# Patient Record
Sex: Female | Born: 1990
Health system: Southern US, Community
[De-identification: ages and names within clinical notes are randomized; demographics above are authoritative.]

## PROBLEM LIST (undated history)

## (undated) DIAGNOSIS — I82409 Acute embolism and thrombosis of unspecified deep veins of unspecified lower extremity: Secondary | ICD-10-CM

## (undated) DIAGNOSIS — E282 Polycystic ovarian syndrome: Secondary | ICD-10-CM

## (undated) DIAGNOSIS — D6851 Activated protein C resistance: Secondary | ICD-10-CM

## (undated) DIAGNOSIS — K5792 Diverticulitis of intestine, part unspecified, without perforation or abscess without bleeding: Secondary | ICD-10-CM

## (undated) DIAGNOSIS — K429 Umbilical hernia without obstruction or gangrene: Secondary | ICD-10-CM

## (undated) DIAGNOSIS — K5900 Constipation, unspecified: Secondary | ICD-10-CM

## (undated) DIAGNOSIS — I2699 Other pulmonary embolism without acute cor pulmonale: Secondary | ICD-10-CM

## (undated) DIAGNOSIS — Z9289 Personal history of other medical treatment: Secondary | ICD-10-CM

## (undated) DIAGNOSIS — I871 Compression of vein: Secondary | ICD-10-CM

## (undated) DIAGNOSIS — N83209 Unspecified ovarian cyst, unspecified side: Secondary | ICD-10-CM

## (undated) DIAGNOSIS — D6859 Other primary thrombophilia: Secondary | ICD-10-CM

## (undated) HISTORY — DX: Umbilical hernia without obstruction or gangrene: K42.9

## (undated) HISTORY — DX: Activated protein C resistance: D68.51

## (undated) HISTORY — DX: Compression of vein: I87.1

## (undated) HISTORY — DX: Polycystic ovarian syndrome: E28.2

## (undated) HISTORY — DX: Acute embolism and thrombosis of unspecified deep veins of unspecified lower extremity: I82.409

## (undated) HISTORY — PX: ADENOIDECTOMY: SUR15

## (undated) HISTORY — PX: TONSILLECTOMY AND ADENOIDECTOMY: SUR1326

## (undated) HISTORY — DX: Other primary thrombophilia: D68.59

## (undated) HISTORY — PX: WISDOM TOOTH EXTRACTION: SHX21

---
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
## 2012-05-02 DIAGNOSIS — I749 Embolism and thrombosis of unspecified artery: Secondary | ICD-10-CM | POA: Insufficient documentation

## 2012-05-02 DIAGNOSIS — G4700 Insomnia, unspecified: Secondary | ICD-10-CM | POA: Insufficient documentation

## 2017-03-11 ENCOUNTER — Other Ambulatory Visit (HOSPITAL_COMMUNITY)
Admission: RE | Admit: 2017-03-11 | Discharge: 2017-03-11 | Disposition: A | Discharge status: Home / Self Care | Payer: BLUE CROSS/BLUE SHIELD | Source: Ambulatory Visit | Attending: Obstetrics & Gynecology | Admitting: Obstetrics & Gynecology

## 2017-03-11 ENCOUNTER — Other Ambulatory Visit: Payer: Self-pay | Admitting: Obstetrics & Gynecology

## 2017-03-11 DIAGNOSIS — Z1151 Encounter for screening for human papillomavirus (HPV): Secondary | ICD-10-CM | POA: Insufficient documentation

## 2017-03-11 DIAGNOSIS — Z01411 Encounter for gynecological examination (general) (routine) with abnormal findings: Secondary | ICD-10-CM | POA: Insufficient documentation

## 2017-03-14 LAB — CYTOLOGY - PAP: HPV (WINDOPATH): DETECTED — AB

## 2017-03-26 ENCOUNTER — Encounter: Payer: Self-pay | Admitting: Hematology

## 2017-04-25 ENCOUNTER — Encounter: Payer: Self-pay | Admitting: Hematology

## 2017-04-25 ENCOUNTER — Telehealth: Payer: Self-pay | Admitting: Hematology

## 2017-04-25 ENCOUNTER — Ambulatory Visit (HOSPITAL_BASED_OUTPATIENT_CLINIC_OR_DEPARTMENT_OTHER): Payer: BLUE CROSS/BLUE SHIELD

## 2017-04-25 ENCOUNTER — Ambulatory Visit (HOSPITAL_BASED_OUTPATIENT_CLINIC_OR_DEPARTMENT_OTHER): Payer: BLUE CROSS/BLUE SHIELD | Admitting: Hematology

## 2017-04-25 VITALS — BP 109/74 | HR 80 | Temp 98.7°F | Resp 18 | Ht 70.0 in | Wt 258.3 lb

## 2017-04-25 DIAGNOSIS — I871 Compression of vein: Secondary | ICD-10-CM

## 2017-04-25 DIAGNOSIS — I2782 Chronic pulmonary embolism: Secondary | ICD-10-CM

## 2017-04-25 DIAGNOSIS — I87009 Postthrombotic syndrome without complications of unspecified extremity: Secondary | ICD-10-CM

## 2017-04-25 DIAGNOSIS — I82512 Chronic embolism and thrombosis of left femoral vein: Secondary | ICD-10-CM

## 2017-04-25 DIAGNOSIS — D6859 Other primary thrombophilia: Secondary | ICD-10-CM

## 2017-04-25 LAB — CBC & DIFF AND RETIC
BASO%: 0.2 % (ref 0.0–2.0)
Basophils Absolute: 0 10*3/uL (ref 0.0–0.1)
EOS ABS: 0.1 10*3/uL (ref 0.0–0.5)
EOS%: 1.2 % (ref 0.0–7.0)
HCT: 40.4 % (ref 34.8–46.6)
HEMOGLOBIN: 13.4 g/dL (ref 11.6–15.9)
IMMATURE RETIC FRACT: 1.8 % (ref 1.60–10.00)
LYMPH%: 24.9 % (ref 14.0–49.7)
MCH: 28.4 pg (ref 25.1–34.0)
MCHC: 33.2 g/dL (ref 31.5–36.0)
MCV: 85.6 fL (ref 79.5–101.0)
MONO#: 0.6 10*3/uL (ref 0.1–0.9)
MONO%: 6.3 % (ref 0.0–14.0)
NEUT#: 6.9 10*3/uL — ABNORMAL HIGH (ref 1.5–6.5)
NEUT%: 67.4 % (ref 38.4–76.8)
PLATELETS: 236 10*3/uL (ref 145–400)
RBC: 4.72 10*6/uL (ref 3.70–5.45)
RDW: 14.2 % (ref 11.2–14.5)
Retic %: 2.24 % — ABNORMAL HIGH (ref 0.70–2.10)
Retic Ct Abs: 105.73 10*3/uL — ABNORMAL HIGH (ref 33.70–90.70)
WBC: 10.2 10*3/uL (ref 3.9–10.3)
lymph#: 2.5 10*3/uL (ref 0.9–3.3)

## 2017-04-25 LAB — COMPREHENSIVE METABOLIC PANEL
ALBUMIN: 4.2 g/dL (ref 3.5–5.0)
ALT: 28 U/L (ref 0–55)
AST: 23 U/L (ref 5–34)
Alkaline Phosphatase: 60 U/L (ref 40–150)
Anion Gap: 8 mEq/L (ref 3–11)
BILIRUBIN TOTAL: 0.48 mg/dL (ref 0.20–1.20)
BUN: 12.5 mg/dL (ref 7.0–26.0)
CO2: 26 meq/L (ref 22–29)
CREATININE: 1 mg/dL (ref 0.6–1.1)
Calcium: 9.6 mg/dL (ref 8.4–10.4)
Chloride: 106 mEq/L (ref 98–109)
EGFR: 76 mL/min/{1.73_m2} — ABNORMAL LOW (ref >90)
GLUCOSE: 84 mg/dL (ref 70–140)
Potassium: 3.8 mEq/L (ref 3.5–5.1)
SODIUM: 140 meq/L (ref 136–145)
TOTAL PROTEIN: 7.8 g/dL (ref 6.4–8.3)

## 2017-04-25 NOTE — Telephone Encounter (Signed)
Lab added per 04/25/17 los. Appointment PRN per 04/25/17. Patient was given a copy of the AVS report per 04/25/17 los.

## 2017-04-25 NOTE — Patient Instructions (Signed)
Thank you for choosing [facility] to provide your oncology and hematology care.  To afford each patient quality time with our providers, please arrive 30 minutes before your scheduled appointment time.  If you arrive late for your appointment, you may be asked to reschedule.  We strive to give you quality time with our providers, and arriving late affects you and other patients whose appointments are after yours.  If you are a no show for multiple scheduled visits, you may be dismissed from the clinic at the providers discretion.   Again, thank you for choosing [facility], our hope is that these requests will decrease the amount of time that you wait before being seen by our physicians.  ______________________________________________________________________ Should you have questions after your visit to the  Cancer Center, please contact our office at [phone] between the hours of 8:30 and 4:30 p.m.    Voicemails left after 4:30p.m will not be returned until the following business day.   For prescription refill requests, please have your pharmacy contact us directly.  Please also try to allow 48 hours for prescription requests.   Please contact the scheduling department for questions regarding scheduling.  For scheduling of procedures such as PET scans, CT scans, MRI, Ultrasound, etc please contact central scheduling at [phone].   Resources For Cancer Patients and Caregivers:  American Cancer Society:  800-227-2345  Can help patients locate various types of support and financial assistance Cancer Care: 1-800-813-HOPE (4673) Provides financial assistance, online support groups, medication/co-pay assistance.   [place] DSS:  336-641-3447 Where to apply for food stamps, Medicaid, and utility assistance Medicare Rights Center: 800-333-4114 Helps people with Medicare understand their rights and benefits, navigate the Medicare system, and secure the  quality healthcare they deserve SCAT: 336-333-6589 [place] [employer]'s shared-ride transportation service for eligible riders who have a disability that prevents them from riding the fixed route bus.   For additional information on assistance programs please contact our social worker:   Grier Hock/Abigail Elmore:  [phone] 

## 2017-04-26 LAB — ANTITHROMBIN III: ANTITHROMBIN ACTIVITY: 69 % — AB (ref 75–135)

## 2017-04-29 LAB — PROTHROMBIN GENE MUTATION

## 2017-04-30 LAB — FACTOR 5 LEIDEN

## 2017-05-17 ENCOUNTER — Telehealth: Payer: Self-pay | Admitting: *Deleted

## 2017-05-17 NOTE — Telephone Encounter (Signed)
Pt LVM to inquire about lab results/additional follow up.  In basket message sent to Dr. Irene Limbo for additional instructions.

## 2017-06-11 ENCOUNTER — Telehealth: Payer: Self-pay

## 2017-06-11 ENCOUNTER — Other Ambulatory Visit: Payer: Self-pay

## 2017-06-11 MED ORDER — WARFARIN SODIUM 10 MG PO TABS
5.0000 mg | ORAL_TABLET | Freq: Every day | ORAL | 0 refills | Status: DC
Start: 2017-06-11 — End: 2018-05-09

## 2017-06-11 NOTE — Telephone Encounter (Signed)
Returning pt call regarding warfarin. Pt okay to take partial dose today and have PCP control warfarin from tomorrow on. [place] does not have a coumadin clinic, and cannot monitor pt at this time.

## 2017-06-11 NOTE — Telephone Encounter (Signed)
Spoke with pt again who verbalized frustration regarding not being given prescription for coumadin. Pt seemed angry over the phone and continued stating, "I don't have time for this" "Why can't you just give me 4 pills to get me through the next few days? Why don't you understand how serious this is? Why can't you monitor my coumadin? I am not going to overdose on it. I don't understand what the problem is."   Spoke with Dr. Irene Limbo who agreed to prescribe coumadin for a month, but PCP is expected to monitor and f/u as we do not have a coumadin clinic. The pt has been taking coumadin for years and should not require a hematologist to monitor. Order placed and pt called to let her know that she could p/u prescription.   Expect to hear from patient tomorrow if there is an issue with her PCP monitoring her coumadin.

## 2017-06-11 NOTE — Progress Notes (Signed)
[facility]    HEMATOLOGY/ONCOLOGY CONSULTATION NOTE  Date of Service: 06/11/2017  Patient Care Team: Patient, No Pcp Per as PCP - General (General Practice) Patient, No Pcp Per (General Practice)  CHIEF COMPLAINTS/PURPOSE OF CONSULTATION:  H/o LLE DVT and Bilateral PE  HISTORY OF PRESENTING ILLNESS:   Heather Lloyd is a wonderful 26 y.o. female who has been referred to [place] by Dr Heather Hoffmann MD for input regarding her previous history of left lower extremity DVT and bilateral PE.  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 She was treated with Lovenox and transitioned to Coumadin and has been on Coumadin anticoagulation since then without any hemorrhagic or thromboembolic complications. She was previously followed by peds hematology  by Dr Heather Lloyd at [facility] and then was seen by Heather Blue PA at [facility] on 05/09/2012.  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  Patient reports that she has been following with [facility] oncology Dr Heather Rona Ravens MD in [facility] and has been stably on Coumadin 10 mg on Monday Wednesday Friday and 7.5 mg on the other days. She notes that she has been monitoring her INR at home.  She notes that she has not had any new venous thromboembolism or bleeding issues on Coumadin. She is on progesterone only oral contraception.  She recently transferred to [facility] and is in the process of setting up a primary care physician.  Family history of heterozygous factor V Leiden mutation in her mother. She notes her mother has not had any issues with thrombosis.   MEDICAL HISTORY:  Past Medical History:  Diagnosis Date  . 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 DVT (deep venous thrombosis) ([place]) 2009  .  Factor V Leiden ([place])     SURGICAL HISTORY: History reviewed. No pertinent surgical history.  SOCIAL HISTORY: Social History   Social History  . Marital status: Married    Spouse name: N/A  . Number of children: N/A  . Years of education: N/A   Occupational History  . Not on file.   Social History Main Topics  . Smoking status: Never Smoker  . Smokeless tobacco: Never Used  . Alcohol use 1.8 oz/week    2 Glasses of wine, 1 Shots of liquor per week  . Drug use: No  . Sexual activity: Yes   Other Topics Concern  . Not on file   Social History Narrative  . No narrative on file    FAMILY HISTORY: Family History  Problem Relation Age of Onset  . Factor V Leiden deficiency Mother   . Diabetes Maternal Grandmother   . Diabetes Maternal Grandfather   . Hypertension Maternal Grandfather     ALLERGIES:  is allergic to codeine and ibuprofen.  MEDICATIONS:  Current Outpatient Prescriptions  Medication Sig Dispense Refill  . norethindrone (MICRONOR,CAMILA,ERRIN) 0.35 MG tablet Take 1 tablet by mouth daily.    Marland Kitchen warfarin (COUMADIN) 10 MG tablet Take 5 mg by mouth daily. MWF 10mg ; All other days 7.5mg      No current facility-administered medications for this visit.     REVIEW OF SYSTEMS:    10 Point review of Systems was done is negative except as noted above.  PHYSICAL EXAMINATION: ECOG PERFORMANCE STATUS: 0 - Asymptomatic  .  Vitals:   04/25/17 1330  BP: 109/74  Pulse: 80  Resp: 18  Temp: 98.7 F (37.1 C)   Filed Weights   04/25/17 1330  Weight: 258 lb 4.8 oz (117.2 kg)   .Body mass index is 37.06 kg/m.  GENERAL:alert, in no acute distress and comfortable SKIN: no acute rashes, no significant lesions EYES: conjunctiva are pink and non-injected, sclera anicteric OROPHARYNX: MMM, no exudates, no oropharyngeal erythema or ulceration NECK: supple, no JVD LYMPH:  no palpable lymphadenopathy in the cervical, axillary or inguinal regions LUNGS: clear to  auscultation b/l with normal respiratory effort HEART: regular rate & rhythm ABDOMEN:  normoactive bowel sounds , non tender, not distended. Extremity: no pedal edema PSYCH: alert & oriented x 3 with fluent speech NEURO: no focal motor/sensory deficits  LABORATORY DATA:  I have reviewed the data as listed  . CBC Latest Ref Rng & Units 04/25/2017  WBC 3.9 - 10.3 10e3/uL 10.2  Hemoglobin 11.6 - 15.9 g/dL 13.4  Hematocrit 34.8 - 46.6 % 40.4  Platelets 145 - 400 10e3/uL 236    . CMP Latest Ref Rng & Units 04/25/2017  Glucose 70 - 140 mg/dl 84  BUN 7.0 - 26.0 mg/dL 12.5  Creatinine 0.6 - 1.1 mg/dL 1.0  Sodium 136 - 145 mEq/L 140  Potassium 3.5 - 5.1 mEq/L 3.8  CO2 22 - 29 mEq/L 26  Calcium 8.4 - 10.4 mg/dL 9.6  Total Protein 6.4 - 8.3 g/dL 7.8  Total Bilirubin 0.20 - 1.20 mg/dL 0.48  Alkaline Phos 40 - 150 U/L 60  AST 5 - 34 U/L 23  ALT 0 - 55 U/L 28           RADIOGRAPHIC STUDIES: I have personally reviewed the radiological images as listed and agreed with the findings in the report. No results found.  ASSESSMENT & PLAN:   26 year old female with  #1 history of unprovoked left lower extremity DVT and bilateral pulmonary embolism with pulmonary infarction at age 66 years. Records note possible May Thurner Syndrome. She has not had any intervention for this. Was also noted to have heterozygous factor V Leiden mutation and low antithrombin III levels as additional hypercoagulable risk factors. She has been seen at [facility] before and recommended long-term anticoagulation. Plan - patient recently transfers from [place] to [place] and is here for Heather additional opinion. -Patient has been tolerating Coumadin well for the last 9 years with no recurrent venous thromboembolism or bleeding issues on Coumadin . Has been doing home INR monitoring . -She reports that her Coumadin dose has remained completely stable over the last few years . She has been on 10 mg Monday Wednesday  Friday and 7.5 mg of Coumadin the rest of the days . -She was recommended to stay on long-term anticoagulation . -We discussed that she needs to set up a primary care physician ASAP in the next week or 2 and that we do not do routine Coumadin monitoring at our clinic and that this would need to be done by her primary care physician . -Her heterozygous factor V Leiden mutation presence was confirmed . -We discussed that if she intends to get pregnant she will need to be switched to a prophylactic dose of Lovenox during pregnancy and for at least 6 weeks after. -We discussed other approaches to preventing recurrent venous thromboembolism including avoiding hydration, using compression socks with traveling and immobility and keeping Heather walking for 5 minutes every hour while traveling.  Patient will follow-up with her primary  care physician locally for continued Coumadin monitoring and other routine medical cares.  Labs today RTC on Heather as needed basis  All of the patients questions were answered with apparent satisfaction. The patient knows to call the clinic with any problems, questions or concerns.  I spent 45 minutes counseling the patient face to face. The total time spent in the appointment was 60 minutes and more than 50% was on counseling and direct patient cares.    Sullivan Lone MD [place] AAHIVMS [facility] Hematology/Oncology Physician [employer]  (Office):       [phone] (Work cell):  [phone] (Fax):           [phone]  06/11/2017 2:27 PM

## 2017-06-11 NOTE — Telephone Encounter (Signed)
This rn started to return pt call, then noticed that [place] had left a message with the pt. This rn asked Samantha RN to call pt back to clarify.

## 2017-06-12 ENCOUNTER — Telehealth: Payer: Self-pay

## 2017-06-12 NOTE — Telephone Encounter (Signed)
Called pt to request name and practice of PCP. Dr. Irene Limbo would like to fax some information for continuity of care.

## 2017-06-14 ENCOUNTER — Telehealth: Payer: Self-pay

## 2017-06-14 NOTE — Telephone Encounter (Signed)
Left message for pt to call back and let us know who her PCP is so that we may transfer information.

## 2017-06-19 ENCOUNTER — Telehealth: Payer: Self-pay

## 2017-06-19 NOTE — Telephone Encounter (Signed)
Pt called back. Her PCP is Dr Seward Carol. Also her warfarin tablet size is normally 5 mg b/c she takes 7.5. She did pick up the 10 mg tabs on 7/23 b/c she was out.

## 2017-10-23 ENCOUNTER — Other Ambulatory Visit: Payer: Self-pay | Admitting: Obstetrics & Gynecology

## 2018-04-19 ENCOUNTER — Emergency Department (HOSPITAL_COMMUNITY): Payer: Commercial Managed Care - PPO

## 2018-04-19 ENCOUNTER — Encounter (HOSPITAL_COMMUNITY): Payer: Self-pay | Admitting: Emergency Medicine

## 2018-04-19 ENCOUNTER — Emergency Department (HOSPITAL_COMMUNITY)
Admission: EM | Admit: 2018-04-19 | Discharge: 2018-04-19 | Disposition: A | Discharge status: Home / Self Care | Payer: Commercial Managed Care - PPO | Attending: Emergency Medicine | Admitting: Emergency Medicine

## 2018-04-19 ENCOUNTER — Other Ambulatory Visit: Payer: Self-pay

## 2018-04-19 DIAGNOSIS — K5732 Diverticulitis of large intestine without perforation or abscess without bleeding: Secondary | ICD-10-CM | POA: Diagnosis not present

## 2018-04-19 DIAGNOSIS — R1032 Left lower quadrant pain: Secondary | ICD-10-CM | POA: Diagnosis present

## 2018-04-19 DIAGNOSIS — Z79899 Other long term (current) drug therapy: Secondary | ICD-10-CM | POA: Insufficient documentation

## 2018-04-19 DIAGNOSIS — D270 Benign neoplasm of right ovary: Secondary | ICD-10-CM | POA: Insufficient documentation

## 2018-04-19 DIAGNOSIS — R1030 Lower abdominal pain, unspecified: Secondary | ICD-10-CM

## 2018-04-19 LAB — COMPREHENSIVE METABOLIC PANEL
ALBUMIN: 3.8 g/dL (ref 3.5–5.0)
ALK PHOS: 56 U/L (ref 38–126)
ALT: 24 U/L (ref 14–54)
ANION GAP: 10 (ref 5–15)
AST: 18 U/L (ref 15–41)
BILIRUBIN TOTAL: 0.5 mg/dL (ref 0.3–1.2)
BUN: 13 mg/dL (ref 6–20)
CALCIUM: 8.9 mg/dL (ref 8.9–10.3)
CO2: 22 mmol/L (ref 22–32)
CREATININE: 0.96 mg/dL (ref 0.44–1.00)
Chloride: 106 mmol/L (ref 101–111)
GFR calc Af Amer: >60 mL/min (ref >60)
GFR calc non Af Amer: >60 mL/min (ref >60)
GLUCOSE: 100 mg/dL — AB (ref 65–99)
Potassium: 3.8 mmol/L (ref 3.5–5.1)
Sodium: 138 mmol/L (ref 135–145)
TOTAL PROTEIN: 7.3 g/dL (ref 6.5–8.1)

## 2018-04-19 LAB — PROTIME-INR
INR: 2.31
Prothrombin Time: 25.2 seconds — ABNORMAL HIGH (ref 11.4–15.2)

## 2018-04-19 LAB — URINALYSIS, ROUTINE W REFLEX MICROSCOPIC
BILIRUBIN URINE: NEGATIVE
Bacteria, UA: NONE SEEN
Glucose, UA: NEGATIVE mg/dL
Ketones, ur: NEGATIVE mg/dL
Leukocytes, UA: NEGATIVE
NITRITE: NEGATIVE
PH: 5 (ref 5.0–8.0)
Protein, ur: NEGATIVE mg/dL
SPECIFIC GRAVITY, URINE: 1.021 (ref 1.005–1.030)

## 2018-04-19 LAB — CBC
HCT: 41.6 % (ref 36.0–46.0)
Hemoglobin: 13.4 g/dL (ref 12.0–15.0)
MCH: 27.6 pg (ref 26.0–34.0)
MCHC: 32.2 g/dL (ref 30.0–36.0)
MCV: 85.8 fL (ref 78.0–100.0)
PLATELETS: 219 10*3/uL (ref 150–400)
RBC: 4.85 MIL/uL (ref 3.87–5.11)
RDW: 14.1 % (ref 11.5–15.5)
WBC: 10.7 10*3/uL — ABNORMAL HIGH (ref 4.0–10.5)

## 2018-04-19 LAB — I-STAT BETA HCG BLOOD, ED (MC, WL, AP ONLY): I-stat hCG, quantitative: <5 m[IU]/mL (ref <5)

## 2018-04-19 LAB — LIPASE, BLOOD: Lipase: 26 U/L (ref 11–51)

## 2018-04-19 MED ORDER — SODIUM CHLORIDE 0.9 % IV BOLUS
1000.0000 mL | Freq: Once | INTRAVENOUS | Status: AC
  Administered 2018-04-19: 1000 mL via INTRAVENOUS

## 2018-04-19 MED ORDER — IOHEXOL 300 MG/ML  SOLN
100.0000 mL | Freq: Once | INTRAMUSCULAR | Status: AC | PRN
  Administered 2018-04-19: 100 mL via INTRAVENOUS

## 2018-04-19 MED ORDER — HYDROCODONE-ACETAMINOPHEN 5-325 MG PO TABS: 1.0000 | ORAL_TABLET | Freq: Four times a day (QID) | ORAL | 0 refills | Status: DC | PRN

## 2018-04-19 MED ORDER — AMOXICILLIN-POT CLAVULANATE 875-125 MG PO TABS: 1.0000 | ORAL_TABLET | Freq: Two times a day (BID) | ORAL | 0 refills | Status: DC

## 2018-04-19 MED ORDER — HYDROCODONE-ACETAMINOPHEN 5-325 MG PO TABS
1.0000 | ORAL_TABLET | Freq: Once | ORAL | Status: AC
  Administered 2018-04-19: 1 via ORAL
  Filled 2018-04-19: qty 1

## 2018-04-19 NOTE — ED Provider Notes (Signed)
[facility] EMERGENCY DEPARTMENT Provider Note   CSN: [id number] Arrival date & time: 04/19/18  1035     History   Chief Complaint Chief Complaint  Patient presents with  . Abdominal Pain    LLQ    HPI Heather Lloyd is a 27 y.o. female with a past medical history of factor V Leiden, anti-thrombin 3 deficiency, who presents today for evaluation of left lower quadrant abdominal pain.  She reports that her pain initially started a few weeks ago when she saw her primary care provider who was concerned that she may have either an ectopic pregnancy or an ovarian cyst.  She followed up with her OB/GYN ordered a transvaginal ultrasound which reportedly showed a cyst on the right side however no cyst on the left side.  She reports that after about a week her pain went away, however it has been back since Wednesday.  She reports that she tried Senokot after the pain started and had a bowel movement out significant relief.  She reports that the pain is waxing and waning, however constant.  She is not not been able to find any exacerbating or relieving factors.  She reports compliance with her Coumadin.   HPI  Past Medical History:  Diagnosis Date  . 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 DVT (deep venous thrombosis) ([place]) 2009  . Factor V Leiden ([place])     There are no active problems to display for this patient.   History reviewed. No pertinent surgical history.   OB History   None      Home Medications    Prior to Admission medications   Medication Sig Start Date End Date Taking? Authorizing Provider  amoxicillin-clavulanate (AUGMENTIN) 875-125 MG tablet Take 1 tablet by mouth every 12 (twelve) hours. 04/19/18   Lorin Glass, PA-C  HYDROcodone-acetaminophen (NORCO/VICODIN) 5-325 MG tablet Take 1 tablet by mouth every 6 (six) hours as needed for severe pain. 04/19/18   Lorin Glass, PA-C  norethindrone (MICRONOR,CAMILA,ERRIN) 0.35 MG tablet Take 1  tablet by mouth daily.    [provider]  warfarin (COUMADIN) 10 MG tablet Take 0.5 tablets (5 mg total) by mouth daily. MWF 10mg ; All other days 7.5mg  06/11/17   Brunetta Genera, MD    Family History Family History  Problem Relation Age of Onset  . Factor V Leiden deficiency Mother   . Diabetes Maternal Grandmother   . Diabetes Maternal Grandfather   . Hypertension Maternal Grandfather     Social History Social History   Tobacco Use  . Smoking status: Never Smoker  . Smokeless tobacco: Never Used  Substance Use Topics  . Alcohol use: Yes    Alcohol/week: 1.8 oz    Types: 2 Glasses of wine, 1 Shots of liquor per week  . Drug use: No     Allergies   Codeine and Ibuprofen   Review of Systems Review of Systems  Constitutional: Negative for chills and fever.  HENT: Negative for sore throat.   Respiratory: Negative for cough and shortness of breath.   Cardiovascular: Negative for chest pain and palpitations.  Gastrointestinal: Positive for abdominal pain and constipation. Negative for blood in stool, diarrhea, nausea and vomiting.  Genitourinary: Negative for dysuria and hematuria.  Musculoskeletal: Negative for arthralgias and back pain.  Skin: Negative for color change and rash.  Neurological: Negative for seizures, syncope and headaches.  All other systems reviewed and are negative.    Physical Exam Updated Vital Signs  BP 132/71   Pulse (!) 102   Temp 98.6 F (37 C) (Oral)   Resp 16   Ht 5\' 10"  (1.778 m)   Wt 120.2 kg (265 lb)   LMP 03/26/2018   SpO2 99%   BMI 38.02 kg/m   Physical Exam  Constitutional: She is oriented to person, place, and time. She appears well-developed and well-nourished. No distress.  HENT:  Head: Normocephalic and atraumatic.  Eyes: Conjunctivae are normal.  Neck: Neck supple.  Cardiovascular: Normal rate and regular rhythm.  No murmur heard. Pulmonary/Chest: Effort normal and breath sounds normal. No respiratory  distress.  Abdominal: Soft. Bowel sounds are normal. There is tenderness in the left lower quadrant. There is no rigidity, no rebound and no guarding.  Abdomen exam is limited by habitus.  Musculoskeletal: She exhibits no edema.  Neurological: She is alert and oriented to person, place, and time.  Skin: Skin is warm and dry.  Psychiatric: She has a normal mood and affect. Her behavior is normal.  Nursing note and vitals reviewed.    ED Treatments / Results  Labs (all labs ordered are listed, but only abnormal results are displayed) Labs Reviewed  COMPREHENSIVE METABOLIC PANEL - Abnormal; Notable for the following components:      Result Value   Glucose, Bld 100 (*)    All other components within normal limits  CBC - Abnormal; Notable for the following components:   WBC 10.7 (*)    All other components within normal limits  URINALYSIS, ROUTINE W REFLEX MICROSCOPIC - Abnormal; Notable for the following components:   Hgb urine dipstick MODERATE (*)    All other components within normal limits  PROTIME-INR - Abnormal; Notable for the following components:   Prothrombin Time 25.2 (*)    All other components within normal limits  LIPASE, BLOOD  I-STAT BETA HCG BLOOD, ED ([facility], [facility], [facility] ONLY)    EKG None  Radiology Ct Abdomen Pelvis W Contrast  Result Date: 04/19/2018 CLINICAL DATA:  Abdominal pain, acute, generalized. EXAM: CT ABDOMEN AND PELVIS WITH CONTRAST TECHNIQUE: Multidetector CT imaging of the abdomen and pelvis was performed using the standard protocol following bolus administration of intravenous contrast. CONTRAST:  148mL OMNIPAQUE IOHEXOL 300 MG/ML  SOLN COMPARISON:  None. FINDINGS: Lower chest: The lung bases are clear without focal nodule, mass, or airspace disease. Heart size is normal. No significant pleural or pericardial effusion is present. Hepatobiliary: There is mild fatty infiltration liver. No focal lesions are present. The common bile duct and gallbladder are  normal. Pancreas: Unremarkable. No pancreatic ductal dilatation or surrounding inflammatory changes. Spleen: Normal in size without focal abnormality. Adrenals/Urinary Tract: Adrenal glands are normal. Kidneys and ureters are within normal limits bilaterally. The urinary bladder is unremarkable. Stomach/Bowel: The stomach and duodenum are within normal limits. Small bowel is unremarkable. The terminal ileum is within normal limits. The appendix is visualized and. The ascending and transverse colon are within normal limits. Proximal descending colon is normal. Foramina tori changes surround the sigmoid colon with diverticular changes. Findings are compatible with acute diverticulitis. There is no focal abscess or free air to suggest perforation. Free fluid does extend into the anatomic pelvis. Vascular/Lymphatic: Extensive venous collaterals extend from the umbilicus to the femoral veins bilaterally. 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 Reproductive: Uterus and left adnexa are within normal limits. A 4.1 x 5.1 x 3.5 cm mass is present in the right adnexa. This mass contains  fat. There is also a focal calcified element which appears to be a tooth. Other: Layering free fluid extends into the anatomic pelvis. No other free fluid or free air is present. No ventral hernia is present. Musculoskeletal: Vertebral body heights alignment are maintained. No focal lytic or blastic lesions are present. IMPRESSION: 1. Sigmoid diverticulitis. 2. 5.1 cm right adnexal mass containing fat and tooth elements compatible with a dermoid. 3. Extensive subcutaneous venous collaterals extending from the umbilicus to the femoral veins bilaterally. 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 4. Hepatic steatosis. Electronically Signed   By: San Morelle M.D.   On: 04/19/2018 15:37    Procedures Procedures  (including critical care time)  Medications Ordered in ED Medications  sodium chloride 0.9 % bolus 1,000 mL (0 mLs Intravenous Stopped 04/19/18 1500)  iohexol (OMNIPAQUE) 300 MG/ML solution 100 mL (100 mLs Intravenous Contrast Given 04/19/18 1449)  HYDROcodone-acetaminophen (NORCO/VICODIN) 5-325 MG per tablet 1 tablet (1 tablet Oral Given 04/19/18 1635)     Initial Impression / Assessment and Plan / ED Course  I have reviewed the triage vital signs and the nursing notes.  Pertinent labs & imaging results that were available during my care of the patient were reviewed by me and considered in my medical decision making (see chart for details).    Patient presents today for evaluation of lower abdominal pain on the left side.  She has previously had a transvaginal ultrasound for evaluation of this pain which revealed a cyst on the right side without any known cause along the left side.  Based on her continued pain and symptoms discussed additional work-up including lab work, CT scan, and consideration for repeat pelvic ultrasound.  Patient had a pelvic exam performed at her OB/GYN/PCP, therefore declined repeat exam.  White count is very slightly elevated at 10.7, UA with moderate blood, patient states she is not not currently menstruating.  Urine not consistent with infection.  After discussions with patient she wishes for CT scan abdomen pelvis.  This was obtained showing left-sided diverticulosis without evidence of abscess or perforation which is the most likely cause for her left-sided pelvic pain.  This will be treated with Augmentin, pain meds.  She is afebrile, and young therefore do not feel like she requires admission.  CT scan also concerning for a teratoma/dermoid cyst with what appears to be a tooth on CT scan per radiologist.  Patient was informed of this, and the need to follow-up with OB/GYN for additional evaluation.  Patient stated her understanding of the importance of following up.  Return  precautions were discussed and patient states her understanding.  PCP/OB/GYN follow-up.  Patient was given follow-up with GI based on her young age and diverticulitis.  Return precautions were discussed with patient and I discharged home.  Prior to prescriptions of narcotic medications the [facility] PMP was queried for the patient.   Final Clinical Impressions(s) / ED Diagnoses   Final diagnoses:  Lower abdominal pain  Teratoma of ovary, right  Diverticulitis of large intestine without perforation or abscess, unspecified bleeding status    ED Discharge Orders        Ordered    HYDROcodone-acetaminophen (NORCO/VICODIN) 5-325 MG tablet  Every 6 hours PRN     04/19/18 1648    amoxicillin-clavulanate (AUGMENTIN) 875-125 MG tablet  Every 12 hours     04/19/18 1648       Lorin Glass, [place] 04/21/18 1118  Drenda Freeze, MD 04/22/18 [phone]

## 2018-04-19 NOTE — ED Notes (Signed)
Pt takes blood thinners, HX PE

## 2018-04-19 NOTE — ED Triage Notes (Signed)
Pt. Stated, Heather Lloyd had stomach pain for 2 weeks went to [place] for [place] has a cyst on rt. Side, continues to have pain on left side.

## 2018-04-19 NOTE — Discharge Instructions (Addendum)
Today your CT scan showed that you have diverticulitis.  This is treated with antibiotics.  I have given you some information to read about it.  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  This needs follow up with your OB/GYN.  Please call their office Monday for an appointment.    Please take Tylenol (acetaminophen) to relieve your pain.  You may take tylenol, up to 1,000 mg (two extra strength pills).  Do not take more than 3,000 mg tylenol in a 24 hour period.  Please check all medication labels as many medications such as pain and cold medications may contain tylenol. Please do not drink alcohol while taking this medication.   Today you received medications that may make you sleepy or impair your ability to make decisions.  For the next 24 hours please do not drive, operate heavy machinery, care for a small child with out another adult present, or perform any activities that may cause harm to you or someone else if you were to fall asleep or be impaired.   You are being prescribed a medication which may make you sleepy. Please follow up of listed precautions for at least 24 hours after taking one dose.  You may have diarrhea from the antibiotics.  It is very important that you continue to take the antibiotics even if you get diarrhea unless a medical professional tells you that you may stop taking them.  If you stop too early the bacteria you are being treated for will become stronger and you may need different, more powerful antibiotics that have more side effects and worsening diarrhea.  Please stay well hydrated and consider probiotics as they may decrease the severity of your diarrhea.  Please be aware that if you take any hormonal contraception (birth control pills, nexplanon, the ring, etc) that your birth control will not work while you are taking antibiotics and you need to use  back up protection as directed on the birth control medication information insert.

## 2018-04-19 NOTE — ED Notes (Signed)
Patient verbalizes understanding of discharge instructions. Opportunity for questioning and answers were provided. Armband removed by staff, pt discharged from ED.  

## 2018-04-30 ENCOUNTER — Inpatient Hospital Stay: Payer: Commercial Managed Care - PPO

## 2018-04-30 ENCOUNTER — Inpatient Hospital Stay: Payer: Commercial Managed Care - PPO | Attending: Hematology & Oncology | Admitting: Hematology & Oncology

## 2018-04-30 ENCOUNTER — Telehealth: Payer: Self-pay | Admitting: Hematology & Oncology

## 2018-04-30 DIAGNOSIS — I82512 Chronic embolism and thrombosis of left femoral vein: Secondary | ICD-10-CM

## 2018-04-30 DIAGNOSIS — D279 Benign neoplasm of unspecified ovary: Secondary | ICD-10-CM | POA: Diagnosis not present

## 2018-04-30 DIAGNOSIS — D682 Hereditary deficiency of other clotting factors: Secondary | ICD-10-CM | POA: Insufficient documentation

## 2018-04-30 DIAGNOSIS — Z7901 Long term (current) use of anticoagulants: Secondary | ICD-10-CM | POA: Insufficient documentation

## 2018-04-30 DIAGNOSIS — Z86711 Personal history of pulmonary embolism: Secondary | ICD-10-CM | POA: Diagnosis not present

## 2018-04-30 DIAGNOSIS — Z86718 Personal history of other venous thrombosis and embolism: Secondary | ICD-10-CM | POA: Insufficient documentation

## 2018-04-30 LAB — CBC WITH DIFFERENTIAL (CANCER CENTER ONLY)
Basophils Absolute: 0 10*3/uL (ref 0.0–0.1)
Basophils Relative: 0 %
Eosinophils Absolute: 0.2 10*3/uL (ref 0.0–0.5)
Eosinophils Relative: 2 %
HEMATOCRIT: 39.7 % (ref 34.8–46.6)
HEMOGLOBIN: 13.2 g/dL (ref 11.6–15.9)
LYMPHS PCT: 33 %
Lymphs Abs: 2.6 10*3/uL (ref 0.9–3.3)
MCH: 28.3 pg (ref 26.0–34.0)
MCHC: 33.2 g/dL (ref 32.0–36.0)
MCV: 85 fL (ref 81.0–101.0)
MONOS PCT: 6 %
Monocytes Absolute: 0.5 10*3/uL (ref 0.1–0.9)
NEUTROS ABS: 4.7 10*3/uL (ref 1.5–6.5)
NEUTROS PCT: 59 %
Platelet Count: 279 10*3/uL (ref 145–400)
RBC: 4.67 MIL/uL (ref 3.70–5.32)
RDW: 13.7 % (ref 11.1–15.7)
WBC Count: 8 10*3/uL (ref 3.9–10.0)

## 2018-04-30 LAB — PROTIME-INR
INR: 1.85
Prothrombin Time: 21.2 seconds — ABNORMAL HIGH (ref 11.4–15.2)

## 2018-04-30 NOTE — Progress Notes (Signed)
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 : I need to figure out what to do about my surgery.  HPI: Ms. Heather Lloyd is a very nice 27 year old white female.  She is from [place] originally.  She currently lives in [place].  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  She has a history of a DVT/pulmonary embolism about 10 years ago.  This was up in [place].  She ultimately ended up at the [facility].  She had a thrombectomy.  She was found to be heterozygous for factor V Leiden.  She also was found to have a low Antithrombin III level.  She has been on Coumadin.  Is been suggested to change her to a new oral anticoagulant.  She has not wanted to do this because of the lack of a reversal agent.  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  She needs surgery.  Her gynecologist want her to see hematology so we could figure out how to get her through surgery without a risk of thrombosis.  She is very much concerned about the possibility of having a blood clot.  She does not have children.  She has done well with Coumadin.  She has her INR checked monthly.  She has some erratic monthly cycles.  She has some chronic left left leg swelling.  She does have a compression stocking that she wears when she exercises.  She has gained some weight.  She does have some diverticulitis.  This is postponed her surgery.  She is been on some antibiotics.  We talked at length about switching to a newer oral anticoagulant.  I am just not sure she needs to be on Coumadin.  I think Eliquis would be a reasonable option for her.  There is a reversal agent for this.  She has had no cough.  She is had no shortness of breath.  Her graph she has had no nausea or vomiting.  Overall, her performance status is ECOG 0.     Past Medical  History:  Diagnosis Date  . Antithrombin III deficiency ([place])   . DVT (deep venous thrombosis) ([place]) 2009  . Factor V Leiden ([place])   :  No past surgical history on file.:   Current Outpatient Medications:  .  polyethylene glycol (MIRALAX / GLYCOLAX) packet, Take 17 g by mouth daily as needed for mild constipation., Disp: , Rfl:  .  norethindrone (MICRONOR,CAMILA,ERRIN) 0.35 MG tablet, Take 1 tablet by mouth daily., Disp: , Rfl:  .  warfarin (COUMADIN) 10 MG tablet, Take 0.5 tablets (5 mg total) by mouth daily. MWF 10mg ; All other days 7.5mg , Disp: 54 tablet, Rfl: 0:  :  Allergies  Allergen Reactions  . Codeine Hives and Swelling  . Ibuprofen     Pt is on blood thinners   :  Family History  Problem Relation Age of Onset  . Factor V Leiden deficiency Mother   . Diabetes Maternal Grandmother   . Diabetes Maternal Grandfather   . Hypertension Maternal Grandfather   :  Social History   Socioeconomic History  . Marital status: Married    Spouse name: Not on file  . Number of children: Not on file  . Years of education: Not on file  . Highest education level: Not on file  Occupational History  . Not on file  Social Needs  . Financial resource strain: Not on file  . Food insecurity:    Worry: Not on file    Inability: Not on file  . Transportation needs:    Medical: Not on file    Non-medical: Not on file  Tobacco Use  . Smoking status: Never Smoker  . Smokeless tobacco: Never Used  Substance and Sexual Activity  . Alcohol use: Yes    Alcohol/week: 1.8 oz    Types: 2 Glasses of wine, 1 Shots of liquor per week  . Drug use: No  . Sexual activity: Yes  Lifestyle  . Physical activity:    Days per week: Not on file    Minutes per session: Not on file  . Stress: Not on file  Relationships  . Social connections:    Talks on phone: Not on file    Gets together: Not on file    Attends religious service: Not on file    Active member of club or organization: Not on  file    Attends meetings of clubs or organizations: Not on file    Relationship status: Not on file  . Intimate partner violence:    Fear of current or ex partner: Not on file    Emotionally abused: Not on file    Physically abused: Not on file    Forced sexual activity: Not on file  Other Topics Concern  . Not on file  Social History Narrative  . Not on file  :  Review of Systems  Constitutional: Negative.   HENT: Negative.   Eyes: Negative.   Respiratory: Negative.   Cardiovascular: Negative.   Gastrointestinal: Negative.   Genitourinary: Negative.   Musculoskeletal: Negative.   Skin: Negative.   Neurological: Negative.   Endo/Heme/Allergies: Negative.   Psychiatric/Behavioral: Negative.      Exam: Obese white female in no obvious distress.  Vital signs show temperature of 97.8.  Pulse 75.  Blood pressure 118/67.  Weight is 264 pounds.  Head neck exam shows no ocular or oral lesions.  There are no palpable cervical or supra clavicular lymph nodes.  Lungs are clear bilaterally.  Cardiac exam regular rate and rhythm with no murmurs, rubs or bruits.  Abdomen is soft.  She is moderately obese.  She has good bowel sounds.  There is no fluid wave.  There is no guarding or rebound tenderness.  There is no palpable liver or spleen tip.  Back exam shows no tenderness over the spine, ribs or hips.  Extremities shows no clubbing, cyanosis or edema.  There might be some slight non-pitting edema of the left leg.  She has good pulses in her distal extremities.  I cannot palpate any venous cord in her legs.  She has a negative Homans sign bilaterally.  Skin exam shows no rashes, ecchymoses or petechia. @IPVITALS @   Recent Labs    04/30/18 0837  WBC 8.0  HGB 13.2  HCT 39.7  PLT 279   No results for input(s): NA, K, CL, CO2, GLUCOSE, BUN, CREATININE, CALCIUM in the last 72 hours.  Blood smear review: None  Pathology: None    Assessment and Plan: Ms. Heather Lloyd is a very nice  27 year old white female.  She has factor V Leiden mutation.  She has low antithrombin 3 level.  She still had one episode of the DVT/PE.  She is on lifelong anticoagulation.  My plan would be to switch her over to Lovenox to get her through her surgery.  I told  her that she would need to be on Lovenox 5 days prior to surgery and then back on Lovenox after surgery until her INR is therapeutic.  She is very much afraid of having a thrombus with surgery while on Lovenox.  I tried to reassure her that I thought that would be highly unlikely.  I do not know if she has a anatomic issue in the pelvis that might predispose her to a thrombus (i.e. May Thurner syndrome).  She thought that she was told that she may have had this.  I told her that if she really wanted to be protective, we get have a retrievable IVC filter placed prior to her surgery and then have it removed after her surgery.  She might want to switch off the Coumadin.  Again I think Eliquis would be a reasonable option for her.  I know that this is twice a day dosing but I think it would be a good treatment for her.  If she were to get pregnant, she would need Lovenox throughout her pregnancy and then for 8 weeks after delivery.  I would like to get a Doppler of her left leg to see how everything looks with respect to residual thromboembolic disease.  We spent a good hour with her today.  All the time was spent face-to-face with her.  We answered all of her questions.  We gave our recommendations.  She has a very good understanding of the situation.  She will let us know when surgery will be.

## 2018-04-30 NOTE — Telephone Encounter (Signed)
No los for 6/19 visit

## 2018-05-01 ENCOUNTER — Encounter (HOSPITAL_COMMUNITY): Payer: Self-pay

## 2018-05-01 MED ORDER — ENOXAPARIN SODIUM 120 MG/0.8ML ~~LOC~~ SOLN: 120.0000 mg | Freq: Two times a day (BID) | SUBCUTANEOUS | 0 refills | Status: DC

## 2018-05-01 NOTE — Addendum Note (Signed)
Addended by: Burney Gauze R on: 05/01/2018 05:45 PM   Modules accepted: Orders

## 2018-05-01 NOTE — H&P (Signed)
27yo G0 who presents for diagnostic laparoscopy, right ovarian cystectomy, possible oophorectomy.  In review, she has been having pelvic discomfort since end of May. Initially the pain was on her left side during menses.  An [place] was performed (04/02/2018) that showed: 7.5cm uterus with thin endometrium and no abnormalities. Right ovary with 4.1cm hyperechoic complex mass- avascular, possible dermoid. Left ovary enlarged with multiple tiny follicles c/w PCOS. No adnexal masses, no free fluid. Since the pain wast mostly on her left side and only during her menses, the plan was to repeat the [place] in a few weeks and if still present proceed with surgical intervention. However, she noted worsening pain and was seen in the ER(6/8) where a CT was completed. Not only was she noted to have diverticulitis, but the cyst was noted to be somewhat larger (5x 4.1x3.5cm) with calficiations- that confirmed a dermoid cyst.  She has since been since by GI- diverticulitis has been treated and was cleared for surgery.   Pt also is FVL heterzygous, antithrombin 3 def. and h/o of PE/DVT in 2009. Currently on warfarin therapy and per pt will be on this medication permanently. Pt was recently seen by hematology and given instructions regarding Lovenox transition before and after surgery      ROS:  CONSTITUTIONAL:  no Chills. no Fatigue. Fever no, no. no Night sweats. no Weight gain. no Weight loss.  HEENT:  Blurrred vision no. no Double vision. no nasal congestion. no Visual changes.  CARDIOLOGY:  Chest pain no, no. no Dyspnea on exertion. no Edema. no Palpitations.  RESPIRATORY:  Shortness of breath no, no. Cough no, no. no Hemoptysis.  UROLOGY:  no Pain with urination. Urinary urgency no, no. Urinary frequency no, no. Urinary incontinence no, no.  GASTROENTEROLOGY:  Patient complains of see HPI for details. Abdominal pain yes, no. no Appetite change. Change in bowel habits yes. no Change in bowel movements. Constipation  yes. no Diarrhea. no Incontinence of stool.  FEMALE REPRODUCTIVE:  See HPI for details. no Abnormal vaginal discharge. no Breast lumps or discharge. Breast pain no, no. no Breast tenderness.  NEUROLOGY:  Dizziness no, no. no Fainting. Headache no, no. no Loss of consciousness.  PSYCHOLOGY:  Anxiety yes, no. Depression no, no.  SKIN:  Rash no, no. Hives no, no.  ENDOCRINOLOGY:  no Cold intolerance. no Excessive thirst. no Excessive urination.  HEMATOLOGY/LYMPH:  no Anemia. no Fatigue. Using Blood Thinners YES, no.         Medical History: Factor 5 Leiden, Antithrombin 3 deficiency, Dvt /pe 2009, Overweight.         Gyn History:  Sexual activity currently sexually active.  Periods : every month.  LMP 03/26/18.  Birth control POP.  Last pap smear date 03/11/2017 ASCUS, HPV+, 10/11/2017 ASCUS.  Denies H/O Last mammogram date.  Denies H/O Abnormal pap smear.  STD Gonorrhea.        OB History:  Never been pregnant per patient.        Surgical History: tonsillectomy , attempt at thrombis extraction left .        Hospitalization/Major Diagnostic Procedure: Blood clot, PE/DVT 2009.        Family History: Father: unknown. Mother: alive, factor v leiden def, diabetes, diagnosed with Diabetes. Paternal [place] Father: unknown. Paternal Grand Mother: unknown. Maternal Grand Father: deceased, Diabetes. Maternal Grand Mother: deceased, Diabetes. 2 brother(s) . .  denies any GYN family cancer hx.       Social History:  General:  Tobacco use  cigarettes: Never smoked Tobacco history last updated 04/22/2018 Additional Findings: Tobacco Non-User Non-smoker for personal reasons Alcohol: yes, occasionally.  no Recreational drug use.  Marital Status: married.  OCCUPATION: Stage manager .        Medications:  Warfarin Sodium . Tablet 10 mg MWF by mouth 7.5 MG TU-TH-SAT-SUN Cyclobenzaprine HCl 5 MG Tablet 1-2 tablets Orally three times a day as needed,  Tramadol  HCl 50 MG Tablet 1 tablet Orally every 12 hours as needed,  Flexeril 10 mg 30 10 mg Tablets one tablet orally at night during menses as needed for pain, Camila(Norethindrone) 0.35 MG Tablet 1 tablet Orally Once a day Vicodin(Acetaminophen-HYDROcodone) 5-300 MG Tablet 1 tablet as needed Orally every 6 hrs,  Amoxicillin 875 MG Tablet 1 tablet Orally Twice a day       Allergies: Ibuprofen, Codeine (for allergy): Swelling - Allergy.    O: Examination in office Objective:     Vitals: Wt 265.9, Wt change .9 lb, Ht 69.75, BMI 38.42, Pulse sitting 88, BP sitting 120/90.       Examination:  General Examination:  CONSTITUTIONAL: well developed, well nourished.  SKIN: warm and dry, no rashes.  NECK: supple, normal appearance.  LUNGS: regular breathing rate and effort.  ABDOMEN: soft, diffuse abdominal pain, mostly lower quadrants, no rebound, no guarding.  MUSCULOSKELETAL no calf tenderness bilaterally.  EXTREMITIES: no edema present.  PSYCH: appropriate mood and affect.        A/P: 27yo G0 who presents for laparoscopic right ovarian cystectomy, possible oophorectomy -NPO -LR @ 125cc/hr -SCDs to OR -h/o DVT on warfarin- transitioned to Lovenox, followed by hematology -Risk/benefit and alternatives reviewed with patient including but not limited to risk of bleeding, infection and injury to surrounding organs.  Discussed ovarian preservation if possible- discussed potential recurrence vs complete oophorectomy.  Concerns and questions were addressed and she desires to proceed.  Janyth Pupa, DO [phone] (cell) [phone] (office)

## 2018-05-01 NOTE — Patient Instructions (Addendum)
Your procedure is scheduled on:  Friday, June 28  Enter through the Main Entrance of [facility] at: 10:15 am  Pick up the phone at the desk and dial [phone].  Call this number if you have problems the morning of surgery: [phone].  Remember: Do NOT eat food or Do NOT drink clear liquids (including water) after midnight Thursday.  Take these medicines the morning of surgery with a SIP OF WATER:  None  Take lovenox as directed by Dr Marin Olp.  Do not take day of surgery.  Stop herbal medications, vitamin supplements, ibuprofen/NSAIDS now.   Do NOT wear jewelry (body piercing), metal hair clips/bobby pins, make-up, or nail polish. Do NOT wear lotions, powders, or perfumes.  You may wear deoderant. Do NOT shave for 48 hours prior to surgery. Do NOT bring valuables to the hospital.  Have a responsible adult drive you home and stay with you for 24 hours after your procedure.  Home with Husband Legrand Como cell [phone] or Mother Verdis Frederickson cell [phone].

## 2018-05-02 ENCOUNTER — Other Ambulatory Visit: Payer: Self-pay | Admitting: *Deleted

## 2018-05-02 MED ORDER — APIXABAN 5 MG PO TABS: 5.0000 mg | ORAL_TABLET | Freq: Two times a day (BID) | ORAL | 12 refills | Status: DC

## 2018-05-02 NOTE — Addendum Note (Signed)
Addended by: Volanda Napoleon on: 05/02/2018 08:18 AM   Modules accepted: Orders

## 2018-05-05 ENCOUNTER — Encounter (HOSPITAL_COMMUNITY)
Admission: RE | Admit: 2018-05-05 | Discharge: 2018-05-05 | Disposition: A | Discharge status: Home / Self Care | Payer: Commercial Managed Care - PPO | Source: Ambulatory Visit | Attending: Obstetrics & Gynecology | Admitting: Obstetrics & Gynecology

## 2018-05-05 ENCOUNTER — Other Ambulatory Visit: Payer: Self-pay

## 2018-05-05 ENCOUNTER — Encounter (HOSPITAL_COMMUNITY): Payer: Self-pay | Admitting: *Deleted

## 2018-05-05 DIAGNOSIS — Z01812 Encounter for preprocedural laboratory examination: Secondary | ICD-10-CM | POA: Insufficient documentation

## 2018-05-05 HISTORY — DX: Constipation, unspecified: K59.00

## 2018-05-05 HISTORY — DX: Personal history of other medical treatment: Z92.89

## 2018-05-05 HISTORY — DX: Other pulmonary embolism without acute cor pulmonale: I26.99

## 2018-05-05 HISTORY — DX: Diverticulitis of intestine, part unspecified, without perforation or abscess without bleeding: K57.92

## 2018-05-05 LAB — COMPREHENSIVE METABOLIC PANEL
ALT: 20 U/L (ref 14–54)
AST: 18 U/L (ref 15–41)
Albumin: 4.1 g/dL (ref 3.5–5.0)
Alkaline Phosphatase: 61 U/L (ref 38–126)
Anion gap: 11 (ref 5–15)
BILIRUBIN TOTAL: 0.4 mg/dL (ref 0.3–1.2)
BUN: 18 mg/dL (ref 6–20)
CHLORIDE: 105 mmol/L (ref 101–111)
CO2: 21 mmol/L — ABNORMAL LOW (ref 22–32)
CREATININE: 1.01 mg/dL — AB (ref 0.44–1.00)
Calcium: 8.7 mg/dL — ABNORMAL LOW (ref 8.9–10.3)
GFR calc Af Amer: >60 mL/min (ref >60)
Glucose, Bld: 102 mg/dL — ABNORMAL HIGH (ref 65–99)
Potassium: 3.9 mmol/L (ref 3.5–5.1)
Sodium: 137 mmol/L (ref 135–145)
Total Protein: 8.4 g/dL — ABNORMAL HIGH (ref 6.5–8.1)

## 2018-05-05 LAB — PROTIME-INR
INR: 2.43
PROTHROMBIN TIME: 26.2 s — AB (ref 11.4–15.2)

## 2018-05-05 LAB — TYPE AND SCREEN
ABO/RH(D): A POS
Antibody Screen: NEGATIVE

## 2018-05-05 LAB — ABO/RH: ABO/RH(D): A POS

## 2018-05-05 NOTE — Pre-Procedure Instructions (Signed)
Called Dr Royce Macadamia.  Informed him of patient's medical history and medications.  Patient has a history of DVT/PE in 2009, Factor V Leiden and Antithrombin III Deficiency.  Patient is cleared for surgery by Dr. Burney Gauze (clearance on chart).  Patient was instructed to take Lovenox 120 mg twice daily (730am & 730pm).  Per Dr Royce Macadamia and Dr. Marin Olp, patient to take Thursday night dose of Lovenox but withhold day of surgery dose.  Patient verbalized understanding.  SDS BB History Log given to lab for patient's previous blood transfusion in [place]

## 2018-05-06 ENCOUNTER — Telehealth: Payer: Self-pay | Admitting: *Deleted

## 2018-05-06 NOTE — Telephone Encounter (Signed)
Message received from patient inquiring how to take Lovenox and Eliquis in regards to her surgery this Friday. She states that her instructions from her pre op visit do not coincide with Dr. Antonieta Pert orders from her visit on 04/30/18.  Call placed back to patient and patient informed per order of Dr. Marin Olp to hold Lovenox on day of surgery and to start Eliquis the day after surgery.  Teach back done.  Patient appreciative of call back and has no further questions or concerns at this time.

## 2018-05-09 ENCOUNTER — Encounter (HOSPITAL_COMMUNITY): Payer: Self-pay

## 2018-05-09 ENCOUNTER — Other Ambulatory Visit: Payer: Self-pay

## 2018-05-09 ENCOUNTER — Ambulatory Visit (HOSPITAL_COMMUNITY): Payer: Commercial Managed Care - PPO | Admitting: Certified Registered Nurse Anesthetist

## 2018-05-09 ENCOUNTER — Ambulatory Visit (HOSPITAL_COMMUNITY)
Admission: AD | Admit: 2018-05-09 | Discharge: 2018-05-09 | Disposition: A | Discharge status: Home / Self Care | Payer: Commercial Managed Care - PPO | Source: Ambulatory Visit | Attending: Obstetrics & Gynecology | Admitting: Obstetrics & Gynecology

## 2018-05-09 ENCOUNTER — Encounter (HOSPITAL_COMMUNITY)
Admission: AD | Disposition: A | Discharge status: Home / Self Care | Payer: Self-pay | Source: Ambulatory Visit | Attending: Obstetrics & Gynecology

## 2018-05-09 DIAGNOSIS — Z833 Family history of diabetes mellitus: Secondary | ICD-10-CM | POA: Diagnosis not present

## 2018-05-09 DIAGNOSIS — D6851 Activated protein C resistance: Secondary | ICD-10-CM | POA: Insufficient documentation

## 2018-05-09 DIAGNOSIS — E669 Obesity, unspecified: Secondary | ICD-10-CM | POA: Insufficient documentation

## 2018-05-09 DIAGNOSIS — Z832 Family history of diseases of the blood and blood-forming organs and certain disorders involving the immune mechanism: Secondary | ICD-10-CM | POA: Insufficient documentation

## 2018-05-09 DIAGNOSIS — D270 Benign neoplasm of right ovary: Secondary | ICD-10-CM | POA: Diagnosis present

## 2018-05-09 DIAGNOSIS — Z86718 Personal history of other venous thrombosis and embolism: Secondary | ICD-10-CM | POA: Insufficient documentation

## 2018-05-09 DIAGNOSIS — Z886 Allergy status to analgesic agent status: Secondary | ICD-10-CM | POA: Diagnosis not present

## 2018-05-09 DIAGNOSIS — Z885 Allergy status to narcotic agent status: Secondary | ICD-10-CM | POA: Insufficient documentation

## 2018-05-09 DIAGNOSIS — Z7901 Long term (current) use of anticoagulants: Secondary | ICD-10-CM | POA: Diagnosis not present

## 2018-05-09 DIAGNOSIS — D6859 Other primary thrombophilia: Secondary | ICD-10-CM | POA: Diagnosis not present

## 2018-05-09 DIAGNOSIS — I2782 Chronic pulmonary embolism: Secondary | ICD-10-CM

## 2018-05-09 HISTORY — PX: OOPHORECTOMY: SHX6387

## 2018-05-09 HISTORY — PX: LAPAROSCOPIC OVARIAN CYSTECTOMY: SHX6248

## 2018-05-09 LAB — PREGNANCY, URINE: PREG TEST UR: NEGATIVE

## 2018-05-09 SURGERY — EXCISION, CYST, OVARY, LAPAROSCOPIC
Anesthesia: General | Site: Abdomen | Laterality: Right

## 2018-05-09 MED ORDER — HYDROMORPHONE HCL 1 MG/ML IJ SOLN
INTRAMUSCULAR | Status: AC
  Filled 2018-05-09: qty 0.5

## 2018-05-09 MED ORDER — PROMETHAZINE HCL 25 MG/ML IJ SOLN: 6.2500 mg | INTRAMUSCULAR | Status: DC | PRN

## 2018-05-09 MED ORDER — TRAMADOL HCL 50 MG PO TABS
ORAL_TABLET | ORAL | Status: AC
  Filled 2018-05-09: qty 1

## 2018-05-09 MED ORDER — MIDAZOLAM HCL 2 MG/2ML IJ SOLN
INTRAMUSCULAR | Status: AC
  Filled 2018-05-09: qty 2

## 2018-05-09 MED ORDER — SUGAMMADEX SODIUM 200 MG/2ML IV SOLN
INTRAVENOUS | Status: AC
Start: 2018-05-09 — End: ?
  Filled 2018-05-09: qty 2

## 2018-05-09 MED ORDER — HYDROMORPHONE HCL 1 MG/ML IJ SOLN
INTRAMUSCULAR | Status: AC
  Administered 2018-05-09: 0.5 mg via INTRAVENOUS
  Filled 2018-05-09: qty 1

## 2018-05-09 MED ORDER — DEXAMETHASONE SODIUM PHOSPHATE 10 MG/ML IJ SOLN
INTRAMUSCULAR | Status: DC | PRN
  Administered 2018-05-09: 10 mg via INTRAVENOUS

## 2018-05-09 MED ORDER — FAMOTIDINE 20 MG PO TABS
20.0000 mg | ORAL_TABLET | Freq: Once | ORAL | Status: AC
  Administered 2018-05-09: 20 mg via ORAL

## 2018-05-09 MED ORDER — BUPIVACAINE HCL (PF) 0.25 % IJ SOLN
INTRAMUSCULAR | Status: DC | PRN
  Administered 2018-05-09: 10 mL

## 2018-05-09 MED ORDER — MIDAZOLAM HCL 2 MG/2ML IJ SOLN
INTRAMUSCULAR | Status: DC | PRN
  Administered 2018-05-09: 2 mg via INTRAVENOUS

## 2018-05-09 MED ORDER — SODIUM CHLORIDE 0.9 % IR SOLN
Status: DC | PRN
  Administered 2018-05-09: 3000 mL

## 2018-05-09 MED ORDER — ROCURONIUM BROMIDE 100 MG/10ML IV SOLN
INTRAVENOUS | Status: AC
  Filled 2018-05-09: qty 1

## 2018-05-09 MED ORDER — ONDANSETRON HCL 4 MG/2ML IJ SOLN
INTRAMUSCULAR | Status: DC | PRN
  Administered 2018-05-09: 4 mg via INTRAVENOUS

## 2018-05-09 MED ORDER — ROCURONIUM BROMIDE 100 MG/10ML IV SOLN
INTRAVENOUS | Status: DC | PRN
  Administered 2018-05-09: 10 mg via INTRAVENOUS
  Administered 2018-05-09: 40 mg via INTRAVENOUS

## 2018-05-09 MED ORDER — SCOPOLAMINE 1 MG/3DAYS TD PT72
MEDICATED_PATCH | TRANSDERMAL | Status: AC
  Administered 2018-05-09: 1.5 mg via TRANSDERMAL
  Filled 2018-05-09: qty 1

## 2018-05-09 MED ORDER — ACETAMINOPHEN 160 MG/5ML PO SOLN
ORAL | Status: AC
  Administered 2018-05-09: 960 mg via ORAL
  Filled 2018-05-09: qty 40.6

## 2018-05-09 MED ORDER — OXYCODONE-ACETAMINOPHEN 5-325 MG PO TABS
1.0000 | ORAL_TABLET | ORAL | Status: DC | PRN
  Administered 2018-05-09: 1 via ORAL

## 2018-05-09 MED ORDER — GLYCOPYRROLATE 0.2 MG/ML IJ SOLN
INTRAMUSCULAR | Status: DC | PRN
  Administered 2018-05-09: 0.2 mg via INTRAVENOUS

## 2018-05-09 MED ORDER — ONDANSETRON HCL 4 MG/2ML IJ SOLN
INTRAMUSCULAR | Status: AC
  Filled 2018-05-09: qty 2

## 2018-05-09 MED ORDER — OXYCODONE-ACETAMINOPHEN 5-325 MG PO TABS: 1.0000 | ORAL_TABLET | Freq: Four times a day (QID) | ORAL | 0 refills | Status: DC | PRN

## 2018-05-09 MED ORDER — LIDOCAINE HCL (CARDIAC) PF 100 MG/5ML IV SOSY
PREFILLED_SYRINGE | INTRAVENOUS | Status: AC
  Filled 2018-05-09: qty 5

## 2018-05-09 MED ORDER — FENTANYL CITRATE (PF) 250 MCG/5ML IJ SOLN
INTRAMUSCULAR | Status: DC | PRN
  Administered 2018-05-09 (×3): 50 ug via INTRAVENOUS
  Administered 2018-05-09: 100 ug via INTRAVENOUS
  Administered 2018-05-09 (×3): 50 ug via INTRAVENOUS

## 2018-05-09 MED ORDER — ACETAMINOPHEN 160 MG/5ML PO SOLN
960.0000 mg | Freq: Once | ORAL | Status: AC
  Administered 2018-05-09: 960 mg via ORAL

## 2018-05-09 MED ORDER — ACETAMINOPHEN 500 MG PO TABS: 1000.0000 mg | ORAL_TABLET | Freq: Once | ORAL | Status: AC

## 2018-05-09 MED ORDER — BUPIVACAINE HCL (PF) 0.25 % IJ SOLN
INTRAMUSCULAR | Status: AC
  Filled 2018-05-09: qty 30

## 2018-05-09 MED ORDER — DEXAMETHASONE SODIUM PHOSPHATE 10 MG/ML IJ SOLN
INTRAMUSCULAR | Status: AC
  Filled 2018-05-09: qty 1

## 2018-05-09 MED ORDER — SCOPOLAMINE 1 MG/3DAYS TD PT72
1.0000 | MEDICATED_PATCH | Freq: Once | TRANSDERMAL | Status: DC
  Administered 2018-05-09: 1.5 mg via TRANSDERMAL

## 2018-05-09 MED ORDER — LACTATED RINGERS IV SOLN
INTRAVENOUS | Status: DC
  Administered 2018-05-09: 125 mL/h via INTRAVENOUS
  Administered 2018-05-09: 12:00:00 via INTRAVENOUS

## 2018-05-09 MED ORDER — LIDOCAINE 2% (20 MG/ML) 5 ML SYRINGE
INTRAMUSCULAR | Status: DC | PRN
  Administered 2018-05-09: 80 mg via INTRAVENOUS

## 2018-05-09 MED ORDER — TRAMADOL HCL 50 MG PO TABS: 50.0000 mg | ORAL_TABLET | Freq: Four times a day (QID) | ORAL | 0 refills | Status: DC | PRN

## 2018-05-09 MED ORDER — FAMOTIDINE 20 MG PO TABS
ORAL_TABLET | ORAL | Status: AC
  Administered 2018-05-09: 20 mg via ORAL
  Filled 2018-05-09: qty 1

## 2018-05-09 MED ORDER — TRAMADOL HCL 50 MG PO TABS: 50.0000 mg | ORAL_TABLET | Freq: Once | ORAL | Status: DC

## 2018-05-09 MED ORDER — FENTANYL CITRATE (PF) 250 MCG/5ML IJ SOLN
INTRAMUSCULAR | Status: AC
  Filled 2018-05-09: qty 5

## 2018-05-09 MED ORDER — SUGAMMADEX SODIUM 200 MG/2ML IV SOLN
INTRAVENOUS | Status: DC | PRN
  Administered 2018-05-09: 200 mg via INTRAVENOUS

## 2018-05-09 MED ORDER — HYDROMORPHONE HCL 1 MG/ML IJ SOLN
0.2500 mg | INTRAMUSCULAR | Status: DC | PRN
  Administered 2018-05-09 (×4): 0.5 mg via INTRAVENOUS

## 2018-05-09 MED ORDER — PROPOFOL 10 MG/ML IV BOLUS
INTRAVENOUS | Status: AC
  Filled 2018-05-09: qty 20

## 2018-05-09 MED ORDER — PROPOFOL 10 MG/ML IV BOLUS
INTRAVENOUS | Status: DC | PRN
  Administered 2018-05-09: 200 mg via INTRAVENOUS

## 2018-05-09 MED ORDER — GLYCOPYRROLATE 0.2 MG/ML IJ SOLN
INTRAMUSCULAR | Status: AC
  Filled 2018-05-09: qty 1

## 2018-05-09 MED ORDER — OXYCODONE-ACETAMINOPHEN 5-325 MG PO TABS
ORAL_TABLET | ORAL | Status: AC
  Administered 2018-05-09: 1 via ORAL
  Filled 2018-05-09: qty 1

## 2018-05-09 MED ORDER — SODIUM CHLORIDE 0.9 % IJ SOLN
INTRAMUSCULAR | Status: AC
  Filled 2018-05-09: qty 10

## 2018-05-09 MED ORDER — HYDROMORPHONE HCL 1 MG/ML IJ SOLN
INTRAMUSCULAR | Status: AC
  Administered 2018-05-09: 0.5 mg via INTRAVENOUS
  Filled 2018-05-09: qty 0.5

## 2018-05-09 SURGICAL SUPPLY — 29 items
DERMABOND ADVANCED (GAUZE/BANDAGES/DRESSINGS) ×1
DERMABOND ADVANCED .7 DNX12 (GAUZE/BANDAGES/DRESSINGS) ×1 IMPLANT
DRSG OPSITE POSTOP 3X4 (GAUZE/BANDAGES/DRESSINGS) IMPLANT
DRSG TELFA 3X8 NADH (GAUZE/BANDAGES/DRESSINGS) ×4 IMPLANT
DURAPREP 26ML APPLICATOR (WOUND CARE) ×2 IMPLANT
FORCEPS CUTTING 33CM 5MM (CUTTING FORCEPS) IMPLANT
GLOVE BIOGEL PI IND STRL 7.0 (GLOVE) ×4 IMPLANT
GLOVE BIOGEL PI INDICATOR 7.0 (GLOVE) ×4
GLOVE ECLIPSE 6.5 STRL STRAW (GLOVE) ×2 IMPLANT
GOWN STRL REUS W/TWL LRG LVL3 (GOWN DISPOSABLE) ×4 IMPLANT
NEEDLE INSUFFLATION 120MM (ENDOMECHANICALS) ×2 IMPLANT
PACK LAPAROSCOPY BASIN (CUSTOM PROCEDURE TRAY) ×2 IMPLANT
PACK TRENDGUARD 450 HYBRID PRO (MISCELLANEOUS) ×1 IMPLANT
PAD OB MATERNITY 4.3X12.25 (PERSONAL CARE ITEMS) ×2 IMPLANT
POUCH SPECIMEN RETRIEVAL 10MM (ENDOMECHANICALS) ×2 IMPLANT
PROTECTOR NERVE ULNAR (MISCELLANEOUS) ×4 IMPLANT
SET IRRIG TUBING LAPAROSCOPIC (IRRIGATION / IRRIGATOR) ×2 IMPLANT
SHEARS HARMONIC ACE PLUS 36CM (ENDOMECHANICALS) ×2 IMPLANT
SLEEVE XCEL OPT CAN 5 100 (ENDOMECHANICALS) ×2 IMPLANT
SOLUTION ELECTROLUBE (MISCELLANEOUS) ×2 IMPLANT
SUT MON AB 4-0 PS1 27 (SUTURE) ×2 IMPLANT
SUT VICRYL 0 UR6 27IN ABS (SUTURE) IMPLANT
SYSTEM CARTER THOMASON II (TROCAR) ×2 IMPLANT
TOWEL OR 17X24 6PK STRL BLUE (TOWEL DISPOSABLE) ×4 IMPLANT
TRAY FOLEY W/BAG SLVR 14FR (SET/KITS/TRAYS/PACK) ×2 IMPLANT
TRENDGUARD 450 HYBRID PRO PACK (MISCELLANEOUS) ×2
TROCAR XCEL NON-BLD 11X100MML (ENDOMECHANICALS) ×2 IMPLANT
TROCAR XCEL NON-BLD 5MMX100MML (ENDOMECHANICALS) ×2 IMPLANT
WARMER LAPAROSCOPE (MISCELLANEOUS) ×2 IMPLANT

## 2018-05-09 NOTE — Op Note (Signed)
PREOPERATIVE DIAGNOSIS:  Right dermoid cyst POSTOPERATIVE DIAGNOSIS: same PROCEDURE PERFORMED: Laparoscopic right oophorectomy SURGEON: Dr. Janyth Pupa ASSISTANT:Dr. Cyndia Skeeters ANESTHESIA: General endotracheal.  ESTIMATED BLOOD LOSS: 20ccc.  URINE OUTPUT: 150cc of clear yellow urine at the end of the procedure.  IV FLUIDS: 1200cc of crystalloid.  SPECIMEN(S): right ovary with dermoid cyst COMPLICATIONS: None.  CONDITION: Stable.  FINDINGS: No ascites or peritoneal studding was appreciated.  Liver, gallbladder and bowel appeared grossly normal. Uterus normal size and shape. Enlarged right ovary, normal left ovary, normal bilateral fallopian tubes.     Informed consent was obtained from the patient prior to taking her to the operating room where anesthesia was found to be adequate. She was placed in dorsal lithotomy position and examined under anesthesia. She was prepped and draped in normal sterile fashion. The bladder was catheterized with a foley under sterile technique.  A bi-valve speculum was then placed and the anterior lip of the cervix was grasped with the single tooth tenaculum. A Hulka manipulator was placed.  The speculum and tenaculum were then removed.  Attention was then turned to the patients abdomen where a 10 mm infraumbilical skin incision was made with the scalpel. The veress needle was carefully introduced into the peritoneal cavity while tenting the abdominal wall. Intraperitoneal placement was confirmed by use of a saline-drop test.  The gas was connected and confirmed intrabdominal placement by a low initial pressure of 85mmHg. The abdomen was then insuflated with CO2 gas. The trocar and sleeve were then advanced without difficulty into the abdomen under direct visualization. Intraabdominal placement was confirmed by the laparoscope and surveillance of the abdomen was performed. Grossly normal appearing abdomen as mentioned in findings above.  Two additional 70mm skin  incision were made in the left and right lower quadrants with placement of the trocar under direct visualization.   The right ovary was inspected, unfortunately no abnormal ovarian tissue was appreciated.  The harmonic was used for serial ligation and dissection of the right ovary.  The endocatch bag was then placed through the 11 mm port and the specimens was removed in its entirety. Re-examination of the uterus and abdominal cavity confirmed hemostasis.  The instruments were then removed from the patients abdomen with air allowed to fully escape. The fascia was closed under direct visualization using 0-vicryl.  Th umbilical site was closed with monocryl. The other incisions were closed with dermabond.  The manipulator was then removed from the cervix. The patient tolerated the procedure well with all sponge, lap, and  needle counts correct. The patient was taken to recovery in stable condition.  Janyth Pupa, DO [phone] (cell) [phone] (office)

## 2018-05-09 NOTE — Interval H&P Note (Signed)
History and Physical Interval Note:  05/09/2018 10:54 AM  Heather Lloyd  has presented today for surgery, with the diagnosis of D27.0 Dermoid cyst of right ovary  The various methods of treatment have been discussed with the patient and family. After consideration of risks, benefits and other options for treatment, the patient has consented to  Procedure(s): LAPAROSCOPIC OVARIAN CYSTECTOMY (Right) OOPHORECTOMY (Right) possible as a surgical intervention .  The patient's history has been reviewed, patient examined, no change in status, stable for surgery.  I have reviewed the patient's chart and labs.  Questions were answered to the patient's satisfaction.     [name]

## 2018-05-09 NOTE — Transfer of Care (Signed)
Immediate Anesthesia Transfer of Care Note  Patient: Heather Lloyd  Procedure(s) Performed: LAPAROSCOPIC OVARIAN CYSTECTOMY (Right Abdomen) OOPHORECTOMY (Right Abdomen)  Patient Location: PACU  Anesthesia Type:General  Level of Consciousness: drowsy  Airway & Oxygen Therapy: Patient Spontanous Breathing and Patient connected to nasal cannula oxygen  Post-op Assessment: Report given to RN and Post -op Vital signs reviewed and stable  Post vital signs: Reviewed and stable  Last Vitals:  Vitals Value Taken Time  BP 126/80 05/09/2018 12:47 PM  Temp    Pulse 95 05/09/2018 12:50 PM  Resp 12 05/09/2018 12:50 PM  SpO2 97 % 05/09/2018 12:50 PM  Vitals shown include unvalidated device data.  Last Pain:  Vitals:   05/09/18 1039  TempSrc: Oral  PainSc: 1       Patients Stated Pain Goal: 4 (61/44/31 5400)  Complications: No apparent anesthesia complications

## 2018-05-09 NOTE — Anesthesia Postprocedure Evaluation (Signed)
Anesthesia Post Note  Patient: Heather Lloyd  Procedure(s) Performed: LAPAROSCOPIC OVARIAN CYSTECTOMY (Right Abdomen) OOPHORECTOMY (Right Abdomen)     Patient location during evaluation: PACU Anesthesia Type: General Level of consciousness: awake and alert Pain management: pain level controlled Vital Signs Assessment: post-procedure vital signs reviewed and stable Respiratory status: spontaneous breathing, nonlabored ventilation, respiratory function stable and patient connected to nasal cannula oxygen Cardiovascular status: blood pressure returned to baseline and stable Postop Assessment: no apparent nausea or vomiting Anesthetic complications: no    Last Vitals:  Vitals:   05/09/18 1315 05/09/18 1429  BP: 128/74 131/77  Pulse: 74 76  Resp: 15 16  Temp:  36.5 C  SpO2: 97% 95%    Last Pain:  Vitals:   05/09/18 1429  TempSrc:   PainSc: 6    Pain Goal: Patients Stated Pain Goal: 4 (05/09/18 1039)               Asad Keeven P Delesa Kawa

## 2018-05-09 NOTE — Anesthesia Preprocedure Evaluation (Addendum)
Anesthesia Evaluation  Patient identified by MRN, date of birth, ID band Patient awake    Reviewed: Allergy & Precautions, NPO status , Patient's Chart, lab work & pertinent test results  Airway Mallampati: III  TM Distance: >3 FB Neck ROM: Full    Dental no notable dental hx.    Pulmonary PE   Pulmonary exam normal breath sounds clear to auscultation       Cardiovascular + DVT  negative cardio ROS Normal cardiovascular exam(-) Valvular Problems/Murmurs Rhythm:Regular Rate:Normal     Neuro/Psych negative neurological ROS  negative psych ROS   GI/Hepatic negative GI ROS, Neg liver ROS,   Endo/Other  negative endocrine ROS  Renal/GU negative Renal ROS     Musculoskeletal negative musculoskeletal ROS (+)   Abdominal (+) + obese,   Peds  Hematology Antithrombin III deficiency  Factor V Leiden  Pre-op eval per Volanda Napoleon, MD Oncology     Anesthesia Other Findings Dermoid cyst of right ovary  Reproductive/Obstetrics hcg negative                           Anesthesia Physical Anesthesia Plan  ASA: II  Anesthesia Plan: General   Post-op Pain Management:    Induction: Intravenous  PONV Risk Score and Plan: 4 or greater and Ondansetron, Dexamethasone, Scopolamine patch - Pre-op and Midazolam  Airway Management Planned: Oral ETT  Additional Equipment:   Intra-op Plan:   Post-operative Plan: Extubation in OR  Informed Consent: I have reviewed the patients History and Physical, chart, labs and discussed the procedure including the risks, benefits and alternatives for the proposed anesthesia with the patient or authorized representative who has indicated his/her understanding and acceptance.   Dental advisory given  Plan Discussed with: CRNA  Anesthesia Plan Comments:         Anesthesia Quick Evaluation

## 2018-05-09 NOTE — Anesthesia Procedure Notes (Signed)
Procedure Name: Intubation Date/Time: 05/09/2018 11:14 AM Performed by: Raenette Rover, CRNA Pre-anesthesia Checklist: Patient identified, Emergency Drugs available, Suction available and Patient being monitored Patient Re-evaluated:Patient Re-evaluated prior to induction Oxygen Delivery Method: Circle system utilized Preoxygenation: Pre-oxygenation with 100% oxygen Induction Type: IV induction Ventilation: Mask ventilation without difficulty Laryngoscope Size: Mac and 3 Grade View: Grade I Tube type: Oral Tube size: 7.0 mm Number of attempts: 1 Airway Equipment and Method: Stylet Placement Confirmation: ETT inserted through vocal cords under direct vision,  positive ETCO2,  CO2 detector and breath sounds checked- equal and bilateral Secured at: 22 cm Tube secured with: Tape Dental Injury: Teeth and Oropharynx as per pre-operative assessment

## 2018-05-09 NOTE — Discharge Instructions (Addendum)
General Anesthesia, Adult, Care After These instructions provide you with information about caring for yourself after your procedure. Your health care provider may also give you more specific instructions. Your treatment has been planned according to current medical practices, but problems sometimes occur. Call your health care provider if you have any problems or questions after your procedure. What can I expect after the procedure? After the procedure, it is common to have:  Vomiting.  A sore throat.  Mental slowness.  It is common to feel:  Nauseous.  Cold or shivery.  Sleepy.  Tired.  Sore or achy, even in parts of your body where you did not have surgery.  Follow these instructions at home: For at least 24 hours after the procedure:  Do not: ? Participate in activities where you could fall or become injured. ? Drive. ? Use heavy machinery. ? Drink alcohol. ? Take sleeping pills or medicines that cause drowsiness. ? Make important decisions or sign legal documents. ? Take care of children on your own.  Rest. Eating and drinking  If you vomit, drink water, juice, or soup when you can drink without vomiting.  Drink enough fluid to keep your urine clear or pale yellow.  Make sure you have little or no nausea before eating solid foods.  Follow the diet recommended by your health care provider. General instructions  Have a responsible adult stay with you until you are awake and alert.  Return to your normal activities as told by your health care provider. Ask your health care provider what activities are safe for you.  Take over-the-counter and prescription medicines only as told by your health care provider.  If you smoke, do not smoke without supervision.  Keep all follow-up visits as told by your health care provider. This is important. Contact a health care provider if:  You continue to have nausea or vomiting at home, and medicines are not helpful.  You  cannot drink fluids or start eating again.  You cannot urinate after 8-12 hours.  You develop a skin rash.  You have fever.  You have increasing redness at the site of your procedure. Get help right away if:  You have difficulty breathing.  You have chest pain.  You have unexpected bleeding.  You feel that you are having a life-threatening or urgent problem. This information is not intended to replace advice given to you by your health care provider. Make sure you discuss any questions you have with your health care provider. Document Released: 02/04/2001 Document Revised: 04/02/2016 Document Reviewed: 10/13/2015 Elsevier Interactive Patient Education  2018 [place] within 3 days.    HOME INSTRUCTIONS  Please note any unusual or excessive bleeding, pain, swelling. Mild dizziness or drowsiness are normal for about 24 hours after surgery.   Shower when comfortable  Restrictions: No driving for 24 hours or while taking pain medications.  Activity:  No heavy lifting (> 10 lbs), nothing in vagina (no tampons, douching, or intercourse) x 4 weeks; no tub baths for 4 weeks Vaginal spotting is expected but if your bleeding is heavy, period like,  please call the office   Incision: the bandaids will fall off when they are ready to; you may clean your incision with mild soap and water but do not rub or scrub the incision site.  You may experience slight bloody drainage from your incision periodically.  This is normal.  If you experience a large amount of drainage or the incision opens, please  call your physician who will likely direct you to the emergency department.  Diet:  You may return to your regular diet.  Do not eat large meals.  Eat small frequent meals throughout the day.  Continue to drink a good amount of water at least 6-8 glasses of water per day, hydration is very important for the healing process.  Pain Management: Take tramadol and/or tylenol as  prescribed/needed for pain.  Tramadol may cause constipation so be sure to continue with miralax as needed.  Always take prescription pain medication with food, it may cause constipation, increase fluids and fiber and you may want to take an over-the-counter stool softener like Colace as needed up to 2x a day.    Alcohol -- Avoid for 24 hours and while taking pain medications.  Nausea: Take sips of ginger ale or soda  Fever -- Call physician if temperature over 101 degrees  Follow up:  If you do not already have a follow up appointment scheduled, please call the office at [phone].  If you experience fever (a temperature greater than 100.4), pain unrelieved by pain medication, shortness of breath, swelling of a single leg, or any other symptoms which are concerning to you please the office immediately.

## 2018-05-10 ENCOUNTER — Encounter (HOSPITAL_COMMUNITY): Payer: Self-pay | Admitting: Obstetrics & Gynecology

## 2018-05-11 ENCOUNTER — Inpatient Hospital Stay (HOSPITAL_COMMUNITY): Payer: Commercial Managed Care - PPO

## 2018-05-11 ENCOUNTER — Encounter (HOSPITAL_COMMUNITY): Payer: Self-pay | Admitting: *Deleted

## 2018-05-11 ENCOUNTER — Inpatient Hospital Stay (HOSPITAL_COMMUNITY)
Admission: AD | Admit: 2018-05-11 | Discharge: 2018-05-11 | Disposition: A | Discharge status: Home / Self Care | Payer: Commercial Managed Care - PPO | Source: Ambulatory Visit | Attending: Obstetrics & Gynecology | Admitting: Obstetrics & Gynecology

## 2018-05-11 DIAGNOSIS — G8918 Other acute postprocedural pain: Secondary | ICD-10-CM

## 2018-05-11 LAB — COMPREHENSIVE METABOLIC PANEL
ALK PHOS: 51 U/L (ref 38–126)
ALT: 82 U/L — AB (ref 0–44)
AST: 48 U/L — AB (ref 15–41)
Albumin: 3.9 g/dL (ref 3.5–5.0)
Anion gap: 9 (ref 5–15)
BILIRUBIN TOTAL: 0.5 mg/dL (ref 0.3–1.2)
BUN: 14 mg/dL (ref 6–20)
CALCIUM: 8.6 mg/dL — AB (ref 8.9–10.3)
CHLORIDE: 106 mmol/L (ref 98–111)
CO2: 23 mmol/L (ref 22–32)
CREATININE: 1 mg/dL (ref 0.44–1.00)
Glucose, Bld: 84 mg/dL (ref 70–99)
Potassium: 3.8 mmol/L (ref 3.5–5.1)
Sodium: 138 mmol/L (ref 135–145)
TOTAL PROTEIN: 7 g/dL (ref 6.5–8.1)

## 2018-05-11 LAB — CBC WITH DIFFERENTIAL/PLATELET
BASOS PCT: 0 %
Basophils Absolute: 0 10*3/uL (ref 0.0–0.1)
EOS ABS: 0.2 10*3/uL (ref 0.0–0.7)
EOS PCT: 2 %
HCT: 37.3 % (ref 36.0–46.0)
Hemoglobin: 11.8 g/dL — ABNORMAL LOW (ref 12.0–15.0)
Lymphocytes Relative: 36 %
Lymphs Abs: 3.5 10*3/uL (ref 0.7–4.0)
MCH: 28 pg (ref 26.0–34.0)
MCHC: 31.6 g/dL (ref 30.0–36.0)
MCV: 88.6 fL (ref 78.0–100.0)
Monocytes Absolute: 0.5 10*3/uL (ref 0.1–1.0)
Monocytes Relative: 5 %
NEUTROS PCT: 57 %
Neutro Abs: 5.6 10*3/uL (ref 1.7–7.7)
PLATELETS: 203 10*3/uL (ref 150–400)
RBC: 4.21 MIL/uL (ref 3.87–5.11)
RDW: 14.2 % (ref 11.5–15.5)
WBC: 9.7 10*3/uL (ref 4.0–10.5)

## 2018-05-11 LAB — RAPID URINE DRUG SCREEN, HOSP PERFORMED
AMPHETAMINES: NOT DETECTED
BENZODIAZEPINES: NOT DETECTED
Cocaine: NOT DETECTED
OPIATES: NOT DETECTED
Tetrahydrocannabinol: NOT DETECTED

## 2018-05-11 LAB — URINALYSIS, ROUTINE W REFLEX MICROSCOPIC
Bilirubin Urine: NEGATIVE
GLUCOSE, UA: NEGATIVE mg/dL
Ketones, ur: NEGATIVE mg/dL
Leukocytes, UA: NEGATIVE
NITRITE: NEGATIVE
PH: 6 (ref 5.0–8.0)
Protein, ur: NEGATIVE mg/dL
Specific Gravity, Urine: 1.009 (ref 1.005–1.030)

## 2018-05-11 MED ORDER — HYDROMORPHONE HCL 1 MG/ML IJ SOLN
1.0000 mg | INTRAMUSCULAR | Status: AC
  Administered 2018-05-11: 1 mg via INTRAMUSCULAR
  Filled 2018-05-11: qty 1

## 2018-05-11 MED ORDER — HYDROMORPHONE HCL 2 MG PO TABS: 1.0000 mg | ORAL_TABLET | Freq: Four times a day (QID) | ORAL | 0 refills | Status: DC | PRN

## 2018-05-11 MED ORDER — PROMETHAZINE HCL 12.5 MG PO TABS: 12.5000 mg | ORAL_TABLET | Freq: Four times a day (QID) | ORAL | 0 refills | Status: DC | PRN

## 2018-05-11 MED ORDER — DIPHENHYDRAMINE HCL 25 MG PO CAPS: 25.0000 mg | ORAL_CAPSULE | ORAL | Status: DC | PRN

## 2018-05-11 NOTE — MAU Note (Signed)
Had cyst & r ovary removed on Friday, has been taking Rx pain meds and it hasn't helped. Some brown discharge.

## 2018-05-11 NOTE — MAU Note (Signed)
Urine in lab 

## 2018-05-11 NOTE — Progress Notes (Signed)
Talked with pharmacist and verified no contraindication for IM dilaudid with pts codeine allergy.

## 2018-05-11 NOTE — MAU Provider Note (Signed)
Chief Complaint: Abdominal Pain  [facility] pts.   SUBJECTIVE HPI: Heather Lloyd is a 27 y.o. No obstetric history on file who presents to MAU s/p right dermoid cyst with right oophorectomy on 05/09/2018. Pt endorses taking her prescribed pain meds with no relief. Pt endorses being prescribed for discharge 5mg /325mg  of percocet, and pt started off taking one percocet and denies it helping, pt then went to 2 percocet in them middle of the night and that's still did not help, then pt called oncall line and inform her to take 3 percocet with one pill of 5mg  of flexeril and pt performed this regimen today at 0800am and 2pm with no relief. Pt endorses the pain in the worse in the center of her stomach. Pt endorse movement makes it worse, sitting still makes it feel better. Movement pain = 7/10 and sitting 5/10. Pt is described at shooting. Pt endorses last BM this morning, log formed in size and large. Pt was also dx with diverticulitis and tx with abx, pt had left lower quandrent pain before that but resolved with treatment. Then they found the dermoid cyst. Pt endorses taking dilaudid in the past and having no pain relief with that, also took Vicodin and had no relief with that. Pt denies ecchymosis or bleeding, no cp, sob, v, d, fever. Pt denies h/o anxiety or depression. Pt mention that she was told by GI that she had an enlarged liver, next appointment with GI is July 20.    Past Medical History:  Diagnosis Date  . 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 Constipation    mild  . Diverticulitis    no current problem  . DVT (deep venous thrombosis) ([place]) 2009   [place] - femoral vein of left lower extremity  . Factor V Leiden ([place])   . History of blood transfusion 2009   [place] [facility]  . Pulmonary embolism ([place]) 2009   surgery to remove clot from left lung   OB History  No data available   Past Surgical History:  Procedure Laterality Date  . LAPAROSCOPIC OVARIAN  CYSTECTOMY Right 05/09/2018   Procedure: LAPAROSCOPIC OVARIAN CYSTECTOMY;  Surgeon: Janyth Pupa, DO;  Location: [facility];  Service: Gynecology;  Laterality: Right;  . OOPHORECTOMY Right 05/09/2018   Procedure: OOPHORECTOMY;  Surgeon: Janyth Pupa, DO;  Location: [facility];  Service: Gynecology;  Laterality: Right;  . THROMBECTOMY  2009   10 yrs ago in [place]  . TONSILLECTOMY AND ADENOIDECTOMY    . WISDOM TOOTH EXTRACTION     Social History   Socioeconomic History  . Marital status: Married    Spouse name: Not on file  . Number of children: Not on file  . Years of education: Not on file  . Highest education level: Not on file  Occupational History  . Not on file  Social Needs  . Financial resource strain: Not on file  . Food insecurity:    Worry: Not on file    Inability: Not on file  . Transportation needs:    Medical: Not on file    Non-medical: Not on file  Tobacco Use  . Smoking status: Never Smoker  . Smokeless tobacco: Never Used  Substance and Sexual Activity  . Alcohol use: Yes    Alcohol/week: 1.8 oz    Types: 2 Glasses of wine, 1 Shots of liquor per week  . Drug use: No  . Sexual activity: Yes    Birth control/protection: Pill  Lifestyle  . Physical activity:    Days per week: Not on file    Minutes per session: Not on file  . Stress: Not on file  Relationships  . Social connections:    Talks on phone: Not on file    Gets together: Not on file    Attends religious service: Not on file    Active member of club or organization: Not on file    Attends meetings of clubs or organizations: Not on file    Relationship status: Not on file  . Intimate partner violence:    Fear of current or ex partner: Not on file    Emotionally abused: Not on file    Physically abused: Not on file    Forced sexual activity: Not on file  Other Topics Concern  . Not on file  Social History Narrative  . Not on file   No current facility-administered medications on file  prior to encounter.    Current Outpatient Medications on File Prior to Encounter  Medication Sig Dispense Refill  . apixaban (ELIQUIS) 5 MG TABS tablet Take 1 tablet (5 mg total) by mouth 2 (two) times daily. 60 tablet 12  . cyclobenzaprine (FLEXERIL) 5 MG tablet Take 5 mg by mouth at bedtime as needed.     . enoxaparin (LOVENOX) 120 MG/0.8ML injection Inject 0.8 mLs (120 mg total) into the skin every 12 (twelve) hours. 20 Syringe 0  . Melatonin 5 MG TABS Take 5 mg by mouth at bedtime as needed (sleep).    . Multiple Vitamin (MULTIVITAMIN WITH MINERALS) TABS tablet Take 2 tablets by mouth daily.    . norethindrone (MICRONOR,CAMILA,ERRIN) 0.35 MG tablet Take 1 tablet by mouth at bedtime.     Marland Kitchen oxyCODONE-acetaminophen (PERCOCET) 5-325 MG tablet Take 1-2 tablets by mouth every 6 (six) hours as needed for up to 7 days for severe pain. 30 tablet 0  . polyethylene glycol (MIRALAX / GLYCOLAX) packet Take 17 g by mouth 2 (two) times daily.     . traMADol (ULTRAM) 50 MG tablet Take 1 tablet (50 mg total) by mouth every 6 (six) hours as needed for up to 7 days for moderate pain or severe pain. 28 tablet 0  . warfarin (COUMADIN) 5 MG tablet Take 7.5-10 mg by mouth See admin instructions. Take 10 mg by mouth once daily at 10 pm on Mon, Wed, and Fri.  Take 7.5 mg by mouth once daily at 10 pm on Tues, Thurs, Sat, and Sun     Allergies  Allergen Reactions  . Codeine Hives and Swelling  . Ibuprofen     Pt is on blood thinners     I have reviewed the past Medical Hx, Surgical Hx, Social Hx, Allergies and Medications.   REVIEW OF SYSTEMS All systems reviewed and are negative for acute change except as noted in the HPI.   OBJECTIVE BP 124/65   Pulse 68   Temp 98.3 F (36.8 C) (Oral)   Resp 18   Ht 5\' 10"  (1.778 m)   Wt 123.4 kg (272 lb)   LMP 04/24/2018 (Exact Date)   BMI 39.03 kg/m    PHYSICAL EXAM Constitutional: Well-developed, well-nourished female in no acute distress.  Cardiovascular:  normal rate and rhythm, pulses intact Respiratory: normal rate and effort.  GI: Abd soft, mild tenderness with palpation, but no rebound tenderness and no abdominal rigiditity, non-distended. Pos BS x 4, circular yellow ecchymosis around umbilicus (pt stated from her lovenox injections)  MS:  Extremities nontender, no edema, normal ROM Neurologic: Alert and oriented x 4. No focal deficits GU: Neg CVAT. Psych: Flat affect  LAB RESULTS Results for orders placed or performed during the hospital encounter of 05/11/18 (from the past 24 hour(s))  Urinalysis, Routine w reflex microscopic     Status: Abnormal   Collection Time: 05/11/18  5:34 PM  Result Value Ref Range   Color, Urine YELLOW YELLOW   APPearance CLEAR CLEAR   Specific Gravity, Urine 1.009 1.005 - 1.030   pH 6.0 5.0 - 8.0   Glucose, UA NEGATIVE NEGATIVE mg/dL   Hgb urine dipstick LARGE (A) NEGATIVE   Bilirubin Urine NEGATIVE NEGATIVE   Ketones, ur NEGATIVE NEGATIVE mg/dL   Protein, ur NEGATIVE NEGATIVE mg/dL   Nitrite NEGATIVE NEGATIVE   Leukocytes, UA NEGATIVE NEGATIVE   RBC / HPF 0-5 0 - 5 RBC/hpf   WBC, UA 0-5 0 - 5 WBC/hpf   Bacteria, UA RARE (A) NONE SEEN   Squamous Epithelial / LPF 0-5 0 - 5   Mucus PRESENT   Urine rapid drug screen (hosp performed)     Status: Abnormal   Collection Time: 05/11/18  5:34 PM  Result Value Ref Range   Opiates NONE DETECTED NONE DETECTED   Cocaine NONE DETECTED NONE DETECTED   Benzodiazepines NONE DETECTED NONE DETECTED   Amphetamines NONE DETECTED NONE DETECTED   Tetrahydrocannabinol NONE DETECTED NONE DETECTED   Barbiturates (A) NONE DETECTED    Result not available. Reagent lot number recalled by manufacturer.  Comprehensive metabolic panel     Status: Abnormal   Collection Time: 05/11/18  5:55 PM  Result Value Ref Range   Sodium 138 135 - 145 mmol/L   Potassium 3.8 3.5 - 5.1 mmol/L   Chloride 106 98 - 111 mmol/L   CO2 23 22 - 32 mmol/L   Glucose, Bld 84 70 - 99 mg/dL   BUN 14  6 - 20 mg/dL   Creatinine, Ser 1.00 0.44 - 1.00 mg/dL   Calcium 8.6 (L) 8.9 - 10.3 mg/dL   Total Protein 7.0 6.5 - 8.1 g/dL   Albumin 3.9 3.5 - 5.0 g/dL   AST 48 (H) 15 - 41 U/L   ALT 82 (H) 0 - 44 U/L   Alkaline Phosphatase 51 38 - 126 U/L   Total Bilirubin 0.5 0.3 - 1.2 mg/dL   GFR calc non Af Amer >60 >60 mL/min   GFR calc Af Amer >60 >60 mL/min   Anion gap 9 5 - 15  CBC with Differential/Platelet     Status: Abnormal   Collection Time: 05/11/18  5:55 PM  Result Value Ref Range   WBC 9.7 4.0 - 10.5 K/uL   RBC 4.21 3.87 - 5.11 MIL/uL   Hemoglobin 11.8 (L) 12.0 - 15.0 g/dL   HCT 37.3 36.0 - 46.0 %   MCV 88.6 78.0 - 100.0 fL   MCH 28.0 26.0 - 34.0 pg   MCHC 31.6 30.0 - 36.0 g/dL   RDW 14.2 11.5 - 15.5 %   Platelets 203 150 - 400 K/uL   Neutrophils Relative % 57 %   Neutro Abs 5.6 1.7 - 7.7 K/uL   Lymphocytes Relative 36 %   Lymphs Abs 3.5 0.7 - 4.0 K/uL   Monocytes Relative 5 %   Monocytes Absolute 0.5 0.1 - 1.0 K/uL   Eosinophils Relative 2 %   Eosinophils Absolute 0.2 0.0 - 0.7 K/uL   Basophils Relative 0 %   Basophils Absolute 0.0 0.0 -  0.1 K/uL    IMAGING US Pelvis (transabdominal Only)  Result Date: 05/11/2018 CLINICAL DATA:  27 y/o  F; right salpingo-oophorectomy. EXAM: TRANSABDOMINAL ULTRASOUND OF PELVIS TECHNIQUE: Transabdominal ultrasound examination of the pelvis was performed including evaluation of the uterus, ovaries, adnexal regions, and pelvic cul-de-sac. COMPARISON:  04/19/2018 CT abdomen and pelvis. FINDINGS: Uterus Measurements: 8.4 x 3.3 x 3.4 cm. No fibroids or other mass visualized. Endometrium Thickness: 4 mm.  No focal abnormality visualized. Right ovary Resected. Left ovary Measurements: 3.2 x 2.8 x 2.4 cm. Normal appearance/no adnexal mass. Other findings:  No abnormal free fluid. IMPRESSION: Right salpingo-oophorectomy.  No acute process identified. Electronically Signed   By: Kristine Garbe M.D.   On: 05/11/2018 19:22   Ct Abdomen Pelvis W  Contrast  Result Date: 04/19/2018 CLINICAL DATA:  Abdominal pain, acute, generalized. EXAM: CT ABDOMEN AND PELVIS WITH CONTRAST TECHNIQUE: Multidetector CT imaging of the abdomen and pelvis was performed using the standard protocol following bolus administration of intravenous contrast. CONTRAST:  175mL OMNIPAQUE IOHEXOL 300 MG/ML  SOLN COMPARISON:  None. FINDINGS: Lower chest: The lung bases are clear without focal nodule, mass, or airspace disease. Heart size is normal. No significant pleural or pericardial effusion is present. Hepatobiliary: There is mild fatty infiltration liver. No focal lesions are present. The common bile duct and gallbladder are normal. Pancreas: Unremarkable. No pancreatic ductal dilatation or surrounding inflammatory changes. Spleen: Normal in size without focal abnormality. Adrenals/Urinary Tract: Adrenal glands are normal. Kidneys and ureters are within normal limits bilaterally. The urinary bladder is unremarkable. Stomach/Bowel: The stomach and duodenum are within normal limits. Small bowel is unremarkable. The terminal ileum is within normal limits. The appendix is visualized and. The ascending and transverse colon are within normal limits. Proximal descending colon is normal. Foramina tori changes surround the sigmoid colon with diverticular changes. Findings are compatible with acute diverticulitis. There is no focal abscess or free air to suggest perforation. Free fluid does extend into the anatomic pelvis. Vascular/Lymphatic: Extensive venous collaterals extend from the umbilicus to the femoral veins bilaterally. 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 Reproductive: Uterus and left adnexa are within normal limits. A 4.1 x 5.1 x 3.5 cm mass is present in the right adnexa. This mass contains fat. There is also a focal calcified element which appears to be a tooth. Other: Layering free fluid extends into the anatomic pelvis.  No other free fluid or free air is present. No ventral hernia is present. Musculoskeletal: Vertebral body heights alignment are maintained. No focal lytic or blastic lesions are present. IMPRESSION: 1. Sigmoid diverticulitis. 2. 5.1 cm right adnexal mass containing fat and tooth elements compatible with a dermoid. 3. Extensive subcutaneous venous collaterals extending from the umbilicus to the femoral veins bilaterally. 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 4. Hepatic steatosis. Electronically Signed   By: San Morelle M.D.   On: 04/19/2018 15:37    MAU Management/MDM: Vitals and nursing notes reviewed Orders Placed This Encounter  Procedures  . US PELVIS (TRANSABDOMINAL ONLY)  . Comprehensive metabolic panel  . CBC with Differential/Platelet  . Urinalysis, Routine w reflex microscopic  . Urine rapid drug screen (hosp performed)  . Diet - low sodium heart healthy  . Increase activity slowly  . Call MD for:  . Call MD for:  temperature >100.4  . Call MD for:  persistant nausea and vomiting  . Call  MD for:  severe uncontrolled pain  . Call MD for:  redness, tenderness, or signs of infection (pain, swelling, redness, odor or green/yellow discharge around incision site)  . Call MD for:  difficulty breathing, headache or visual disturbances  . Call MD for:  hives  . Call MD for:  persistant dizziness or light-headedness  . Call MD for:  extreme fatigue  . Discharge patient Discharge disposition: 01-Home or Self Care; Discharge patient date: 05/11/2018    Meds ordered this encounter  Medications  . HYDROmorphone (DILAUDID) injection 1 mg  . diphenhydrAMINE (BENADRYL) capsule 25 mg  . promethazine (PHENERGAN) 12.5 MG tablet    Sig: Take 1 tablet (12.5 mg total) by mouth every 6 (six) hours as needed for nausea or vomiting.    Dispense:  30 tablet    Refill:  0    Order Specific Question:   Supervising Provider     Answer:   ROBERTS, Delainey [[id number]]  . HYDROmorphone (DILAUDID) 2 MG tablet    Sig: Take 0.5 tablets (1 mg total) by mouth every 6 (six) hours as needed for severe pain.    Dispense:  15 tablet    Refill:  0    Order Specific Question:   Supervising Provider    Answer:   Everett Graff [[id number]]    Plan of care reviewed with patient, including labs and tests ordered and medical treatment.  Consult Dr Mancel Bale.  Treatments in MAU included cbc, cmp, [place], im dilaudid, utp, and ua, if no pertinent acute finding pt to be discharge home and f/u with [facility] in 1 week.   ASSESSMENT: Heather Lloyd is a 27 y.o. No obstetric history on file who presents to MAU s/p right dermoid cyst with right oophorectomy on 05/09/2018 for post op pain, no acute PE finding found, US unremarkable and cbc hgb 11.8 rest wnl, , cmp showed elevated ast (48) elevated alt (82) . Urine drug screened no neg all meds except positive on barbituate. Pt stable and received relief with IM dilaudid. Pt stable in NAD now. [place] of pelvis was unremarkable. Pt offered to stay for 24 hour observation then be evaluated by Dr Nelda Marseille in the morning but pt declined and wanted to be d/c home, pt endorsed feeling much better.  1. Postoperative pain   2. Post-op pain     PLAN Discharge home in stable condition with pain controlled. Pt discharged with strict post-op infection or bleeding precautions. Rest with occasional ambulation to decrease blood clots.  Pain: PO dilaudid with phenergan per Dr Alwyn Pea F/u with GI on appointment already made July 20.  Counseled on return precautions Handout given Follow-up Information    [facility], [facility] Follow up in 7 day(s).   Specialty:  Obstetrics and Gynecology Contact information: [address] [place] [address] [phone]          Dr Mancel Bale update and verbalized agreement and guidance of plan of care.   8:26 PM  Dr Alwyn Pea updated on plan of care and  verbalized understanding.   Wesam Gearhart  05/11/2018, 8:26 PM

## 2018-05-22 ENCOUNTER — Ambulatory Visit (HOSPITAL_COMMUNITY)
Admission: RE | Admit: 2018-05-22 | Discharge: 2018-05-22 | Disposition: A | Discharge status: Home / Self Care | Payer: Commercial Managed Care - PPO | Source: Ambulatory Visit | Attending: Obstetrics & Gynecology | Admitting: Obstetrics & Gynecology

## 2018-05-22 ENCOUNTER — Encounter (HOSPITAL_COMMUNITY): Payer: Self-pay

## 2018-05-22 DIAGNOSIS — Z3169 Encounter for other general counseling and advice on procreation: Secondary | ICD-10-CM | POA: Diagnosis present

## 2018-05-22 DIAGNOSIS — Z86711 Personal history of pulmonary embolism: Secondary | ICD-10-CM

## 2018-05-22 DIAGNOSIS — Z885 Allergy status to narcotic agent status: Secondary | ICD-10-CM | POA: Insufficient documentation

## 2018-05-22 DIAGNOSIS — D6851 Activated protein C resistance: Secondary | ICD-10-CM | POA: Insufficient documentation

## 2018-05-22 DIAGNOSIS — Z7901 Long term (current) use of anticoagulants: Secondary | ICD-10-CM | POA: Insufficient documentation

## 2018-05-22 DIAGNOSIS — Z86718 Personal history of other venous thrombosis and embolism: Secondary | ICD-10-CM | POA: Insufficient documentation

## 2018-05-22 DIAGNOSIS — D6859 Other primary thrombophilia: Secondary | ICD-10-CM | POA: Diagnosis not present

## 2018-05-22 HISTORY — DX: Unspecified ovarian cyst, unspecified side: N83.209

## 2018-05-22 NOTE — Consult Note (Addendum)
CONSULT NOTE      MATERNAL FETAL MEDICINE CONSULT  Patient Name: Heather Lloyd  Medical Record Number: Date of Birth: [birth year]  Requesting Physician Name: Dr. Janyth Pupa Date of Service: May 22, 2018  I had the pleasure of seeing Heather Lloyd today at the [facility] for maternal fetal care.  She was accompanied by her husband.  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  In 2009 patient was admitted at [facility] with the diagnosis of deep vein thrombosis (DVT).  She also had pulmonary embolism either at the same time or following DVT.  Patient reports that clot removal from her leg was attempted by vascular surgeons which was partially successful.  She was sent home on Lovenox but readmitted 2 days later with worsening PE.  Following initial treatment patient took warfarin for several years.  She did not have any recurrence of venous thromboembolism.  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 Patient also mentioned that she had IVC filter placed in 2009 that was later removed.  She reports that she was recommended against having an IVC filter.  Two weeks ago she had laparoscopic surgery and right dermoid cyst removal.  Before surgery the patient was admitted in the ER with left abdominal pain.  CT scan showed dermoid and diverticulitis.  Patient does not have any bowel complaints and diverticulitis seems to be an incidental finding.During and for 4 days after surgery the patient was taking Lovenox 120 mg twice daily.  Now patient is taking Eliquis 5 mg twice daily.  Patient is being followed by her hematologist Dr. Burney Gauze.   GYN history: Patient gives history of abnormal Pap smears but no cervical surgeries.  She takes progesterone only contraceptive pills.  Medical history: No history of hypertension or diabetes or any other chronic medical  conditions.  Surgical history: Tonsillectomy, laparoscopic dermoid cyst removal, vascular surgeries.  Medications: Eliquis, progesterone contraceptive pills.  Allergies: Codeine (swelling of face).  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  Husband is a [place] and he is in good health.  Family: No history of venous thromboembolism in the family mother has factor V Leiden mutation but had 3 uneventful vaginal deliveries.  Does not have knowledge of her father's medical conditions.  I counseled the patient on the following: History of DVT and pulmonary embolism:  I explained that pregnancy increases the risk of VTE about 4-5 fold, but the absolute risk is very small.  Patient has a history of venous thromboembolism that was not hormone related.  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  Although the risk of pulmonary embolism and DVT is higher density is not contraindicated.  I informed the couple they need to consider the possibility of having recurrent pulmonary embolism in pregnancy.  I would recommend therapeutic dosage of Lovenox in pregnancy.  I have recommended that she initiate Lovenox when she plans pregnancy.  I also advised her on the prevention of risk factors including prolonged immobility (travel) stop he does not have risk factors including smoking. Our goal will be to achieve vaginal delivery as cesarean section increase the risk of thromboembolism. I also offered to set up consultation with our genetic counselor about the inheritance of thrombophilia mutations.  Preconception: I discussed the importance of taking preconception folic acid  400 mcg daily for at least 1 month before conception to reduce the likelihood of having fetal spina bifid.    Recommendations: -Preconception folic acid 099 mcg daily. -Therapeutic dosage of Lovenox to be started when  she plans pregnancy. -Maternal-fetal medicine consultation early in pregnancy.  Thank you for consultation.  If you have any questions or concerns please do not hesitate to contact me.  Consultation including face-to-face counseling 40 minutes.

## 2018-05-22 NOTE — ED Notes (Signed)
Pt in for preconception consult with Dr. Donalee Citrin.  BP 108/75, pulse 91, weight 263.4.

## 2018-06-11 NOTE — Addendum Note (Signed)
Encounter addended by: Tama High, MD on: 06/11/2018 2:39 PM  Actions taken: Sign clinical note

## 2019-01-15 DIAGNOSIS — J329 Chronic sinusitis, unspecified: Secondary | ICD-10-CM | POA: Diagnosis not present

## 2019-01-15 DIAGNOSIS — R0981 Nasal congestion: Secondary | ICD-10-CM | POA: Diagnosis not present

## 2019-01-29 DIAGNOSIS — L700 Acne vulgaris: Secondary | ICD-10-CM | POA: Diagnosis not present

## 2019-01-29 DIAGNOSIS — Z79899 Other long term (current) drug therapy: Secondary | ICD-10-CM | POA: Diagnosis not present

## 2019-01-29 DIAGNOSIS — L74510 Primary focal hyperhidrosis, axilla: Secondary | ICD-10-CM | POA: Diagnosis not present

## 2019-03-04 DIAGNOSIS — L700 Acne vulgaris: Secondary | ICD-10-CM | POA: Diagnosis not present

## 2019-03-04 DIAGNOSIS — Z79899 Other long term (current) drug therapy: Secondary | ICD-10-CM | POA: Diagnosis not present

## 2019-03-05 ENCOUNTER — Telehealth: Payer: Self-pay | Admitting: *Deleted

## 2019-03-05 NOTE — Telephone Encounter (Signed)
Patient is being prescribed Accutane and she wants to make sure this is okay while also being on Eliquis.  Spoke with Dr Marin Olp and he is okay with patient taking both Accutane and Eliquis. Patient aware.

## 2019-05-18 ENCOUNTER — Other Ambulatory Visit: Payer: Self-pay | Admitting: Hematology & Oncology

## 2019-06-19 ENCOUNTER — Other Ambulatory Visit: Payer: Self-pay | Admitting: Hematology & Oncology

## 2019-07-18 ENCOUNTER — Other Ambulatory Visit: Payer: Self-pay | Admitting: Hematology & Oncology

## 2019-08-16 ENCOUNTER — Other Ambulatory Visit: Payer: Self-pay | Admitting: Hematology & Oncology

## 2019-09-17 ENCOUNTER — Other Ambulatory Visit: Payer: Self-pay | Admitting: Hematology & Oncology

## 2019-10-12 ENCOUNTER — Telehealth: Payer: Self-pay | Admitting: *Deleted

## 2019-10-12 NOTE — Telephone Encounter (Signed)
Received a call from patient asking about interaction of supplements that she was going to start and her Eliquis.  She is taking Omega 3s, Vit D3, Lcarnitine and Inositol.  Dr. Marin Olp states ok to take Omega 3, and Vit D3 but no studies done with LCarnitine and Inositol.  Can ask her pharmacist but doubts there is any information out there about interaction.  Patient hasnt been seen in office since 6/19, told patient we need to see her in the office,  Appt made

## 2019-10-16 ENCOUNTER — Other Ambulatory Visit: Payer: Self-pay | Admitting: Hematology & Oncology

## 2019-11-03 ENCOUNTER — Other Ambulatory Visit: Payer: Self-pay | Admitting: *Deleted

## 2019-11-03 DIAGNOSIS — I82512 Chronic embolism and thrombosis of left femoral vein: Secondary | ICD-10-CM

## 2019-11-04 ENCOUNTER — Inpatient Hospital Stay: Payer: 59 | Attending: Hematology & Oncology | Admitting: Family

## 2019-11-04 ENCOUNTER — Other Ambulatory Visit: Payer: Self-pay

## 2019-11-04 ENCOUNTER — Inpatient Hospital Stay: Payer: 59

## 2019-11-04 ENCOUNTER — Other Ambulatory Visit: Payer: Commercial Managed Care - PPO

## 2019-11-04 DIAGNOSIS — G8929 Other chronic pain: Secondary | ICD-10-CM | POA: Insufficient documentation

## 2019-11-04 DIAGNOSIS — D6851 Activated protein C resistance: Secondary | ICD-10-CM | POA: Diagnosis present

## 2019-11-04 DIAGNOSIS — Z86718 Personal history of other venous thrombosis and embolism: Secondary | ICD-10-CM | POA: Diagnosis not present

## 2019-11-04 DIAGNOSIS — D6859 Other primary thrombophilia: Secondary | ICD-10-CM | POA: Diagnosis not present

## 2019-11-04 DIAGNOSIS — Z86711 Personal history of pulmonary embolism: Secondary | ICD-10-CM | POA: Insufficient documentation

## 2019-11-04 DIAGNOSIS — Z7901 Long term (current) use of anticoagulants: Secondary | ICD-10-CM | POA: Insufficient documentation

## 2019-11-04 DIAGNOSIS — I82512 Chronic embolism and thrombosis of left femoral vein: Secondary | ICD-10-CM | POA: Diagnosis not present

## 2019-11-04 LAB — CMP (CANCER CENTER ONLY)
ALT: 36 U/L (ref 0–44)
AST: 22 U/L (ref 15–41)
Albumin: 4.5 g/dL (ref 3.5–5.0)
Alkaline Phosphatase: 59 U/L (ref 38–126)
Anion gap: 9 (ref 5–15)
BUN: 14 mg/dL (ref 6–20)
CO2: 25 mmol/L (ref 22–32)
Calcium: 9.3 mg/dL (ref 8.9–10.3)
Chloride: 104 mmol/L (ref 98–111)
Creatinine: 0.91 mg/dL (ref 0.44–1.00)
GFR, Est AFR Am: >60 mL/min (ref >60)
GFR, Estimated: >60 mL/min (ref >60)
Glucose, Bld: 105 mg/dL — ABNORMAL HIGH (ref 70–99)
Potassium: 3.8 mmol/L (ref 3.5–5.1)
Sodium: 138 mmol/L (ref 135–145)
Total Bilirubin: 0.6 mg/dL (ref 0.3–1.2)
Total Protein: 7.5 g/dL (ref 6.5–8.1)

## 2019-11-04 LAB — CBC WITH DIFFERENTIAL (CANCER CENTER ONLY)
Abs Immature Granulocytes: 0.04 10*3/uL (ref 0.00–0.07)
Basophils Absolute: 0.1 10*3/uL (ref 0.0–0.1)
Basophils Relative: 1 %
Eosinophils Absolute: 0.1 10*3/uL (ref 0.0–0.5)
Eosinophils Relative: 1 %
HCT: 38.8 % (ref 36.0–46.0)
Hemoglobin: 13.1 g/dL (ref 12.0–15.0)
Immature Granulocytes: 0 %
Lymphocytes Relative: 27 %
Lymphs Abs: 2.6 10*3/uL (ref 0.7–4.0)
MCH: 28.9 pg (ref 26.0–34.0)
MCHC: 33.8 g/dL (ref 30.0–36.0)
MCV: 85.5 fL (ref 80.0–100.0)
Monocytes Absolute: 0.6 10*3/uL (ref 0.1–1.0)
Monocytes Relative: 6 %
Neutro Abs: 6.3 10*3/uL (ref 1.7–7.7)
Neutrophils Relative %: 65 %
Platelet Count: 268 10*3/uL (ref 150–400)
RBC: 4.54 MIL/uL (ref 3.87–5.11)
RDW: 13 % (ref 11.5–15.5)
WBC Count: 9.6 10*3/uL (ref 4.0–10.5)
nRBC: 0 % (ref 0.0–0.2)

## 2019-11-04 NOTE — Progress Notes (Signed)
Hematology and Oncology Follow Up Visit  Heather Lloyd [id number] 02/28/1991 28 y.o. 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 She is doing well but has chronic pain/cramping and swelling in the left leg. 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 She wants to hold off on this for now. She really wants to avoid any invasive procedures.  She is also wanting to start trying to have a baby next year. She states that when she was a teenager she quickly had a recurrent clot after being sent home from the hospital on Lovenox. She is unsure of the dosage or if it was twice a day. She also had questions about breast feeding and Eliquis. I have presented all these to Heather Lloyd in pharmacy and we will be working to get a plan together for when she does become pregnant.  She is currently on progesterone based BC.  She has had no issues with bleeding. No bruising or petechiae.  No fever, chills, n/v, cough, rash, dizziness, SOB, chest pain, palpitations, abdominal pain or changes in bowel or bladder habits.  No numbness or tingling in her extremities.  No falls or syncopal episodes to report.  She has maintained a good appetite but admits that there are times where she needs to better hydrate.  She states that she had a colonoscopy and had 3 polyps removed. One was precancerous and she will have a repeat colonoscopy in 2 years.   ECOG Performance Status: 1 - Symptomatic but completely ambulatory  Medications:  Allergies as of 11/04/2019      Reactions   Codeine Hives, Swelling   Ibuprofen    Pt is on blood  thinners      Medication List       Accurate as of November 04, 2019 10:55 AM. If you have any questions, ask your nurse or doctor.        Eliquis 5 MG Tabs tablet Generic drug: apixaban Take 1 tablet by mouth twice daily   HYDROmorphone 2 MG tablet Commonly known as: Dilaudid Take 0.5 tablets (1 mg total) by mouth every 6 (six) hours as needed for severe pain.   Melatonin 5 MG Tabs Take 5 mg by mouth at bedtime as needed (sleep).   multivitamin with minerals Tabs tablet Take 2 tablets by mouth daily.   norethindrone 0.35 MG tablet Commonly known as: MICRONOR Take 1 tablet by mouth at bedtime.   polyethylene glycol 17 g packet Commonly known as: MIRALAX / GLYCOLAX Take 17 g by mouth 2 (two) times daily.   promethazine 12.5 MG tablet Commonly known as: PHENERGAN Take 1 tablet (12.5 mg total) by mouth every 6 (six) hours as needed for nausea or vomiting.       Allergies:  Allergies  Allergen Reactions  . Codeine Hives and Swelling  . Ibuprofen     Pt is on blood thinners     Past Medical History, Surgical history, Social history, and Family History were reviewed and updated.  Review of Systems: All other 10 point review of systems is negative.   Physical Exam:  vitals were not taken for this visit.  Wt Readings from Last 3 Encounters:  05/11/18 272 lb (123.4 kg)  05/05/18 265 lb 4 oz (120.3 kg)  04/19/18 265 lb (120.2 kg)     Lab Results  Component Value Date   WBC 9.6 11/04/2019   HGB 13.1 11/04/2019   HCT 38.8 11/04/2019   MCV 85.5 11/04/2019   PLT 268 11/04/2019   No results found for: FERRITIN, IRON, TIBC, UIBC, IRONPCTSAT Lab Results  Component Value Date   RETICCTPCT 2.24 (H) 04/25/2017   RBC 4.54 11/04/2019   RETICCTABS 105.73 (H) 04/25/2017   No results found for: KPAFRELGTCHN, LAMBDASER, KAPLAMBRATIO No results found for: IGGSERUM, IGA, IGMSERUM No results found for: Odetta Pink, SPEI   Chemistry      Component Value Date/Time   NA 138 05/11/2018 1755   NA 140 04/25/2017 1550   K 3.8 05/11/2018 1755   K 3.8 04/25/2017 1550   CL 106 05/11/2018 1755   CO2 23 05/11/2018 1755   CO2 26 04/25/2017 1550   BUN 14 05/11/2018 1755   BUN 12.5 04/25/2017 1550   CREATININE 1.00 05/11/2018 1755   CREATININE 1.0 04/25/2017 1550      Component Value Date/Time   CALCIUM 8.6 (L) 05/11/2018 1755   CALCIUM 9.6 04/25/2017 1550   ALKPHOS 51 05/11/2018 1755   ALKPHOS 60 04/25/2017 1550   AST 48 (H) 05/11/2018 1755   AST 23 04/25/2017 1550   ALT 82 (H) 05/11/2018 1755   ALT 28 04/25/2017 1550   BILITOT 0.5 05/11/2018 1755   BILITOT 0.48 04/25/2017 1550       Impression and Plan: Heather Lloyd is a very pleasant 28 yo caucasian female with history of left lower extremity DVT and bilateral pulmonary emboli, factor V Leiden mutation-heterozygous, antithrombin III deficiency and 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   She is doing well on Eliquis and so far there has been no evidence of recurrence.  She will continue to consider treatment for the May-Thurner syndrome and will let us know if she changes her mind.  We will continue to discuss her plan of care during pregnancy and postpartum.  We will see her back in another 4 months for follow-up.  She will contact our office with any questions or concerns. We can certainly see her sooner if needed.   Heather Peace, NP 12/23/202010:55 AM

## 2019-11-05 ENCOUNTER — Telehealth: Payer: Self-pay | Admitting: Hematology & Oncology

## 2019-11-05 NOTE — Telephone Encounter (Signed)
Called and spoke with patient regarding appointments per 12/23 los

## 2019-11-14 ENCOUNTER — Other Ambulatory Visit: Payer: Self-pay | Admitting: Hematology & Oncology

## 2019-12-13 ENCOUNTER — Other Ambulatory Visit: Payer: Self-pay | Admitting: Hematology & Oncology

## 2020-01-10 ENCOUNTER — Other Ambulatory Visit: Payer: Self-pay | Admitting: Hematology & Oncology

## 2020-02-09 ENCOUNTER — Other Ambulatory Visit: Payer: Self-pay | Admitting: Hematology & Oncology

## 2020-03-03 ENCOUNTER — Inpatient Hospital Stay: Payer: 59 | Attending: Internal Medicine

## 2020-03-03 ENCOUNTER — Inpatient Hospital Stay: Payer: 59 | Admitting: Hematology & Oncology

## 2020-03-13 ENCOUNTER — Other Ambulatory Visit: Payer: Self-pay | Admitting: Hematology & Oncology

## 2020-04-10 ENCOUNTER — Other Ambulatory Visit: Payer: Self-pay | Admitting: Hematology & Oncology

## 2020-05-10 ENCOUNTER — Other Ambulatory Visit: Payer: Self-pay | Admitting: Hematology & Oncology

## 2020-05-31 ENCOUNTER — Telehealth: Payer: Self-pay | Admitting: Hematology & Oncology

## 2020-05-31 NOTE — Telephone Encounter (Signed)
Called and LMVM for patient regarding appointments that have been rescheduled per her request from phone message 7/20

## 2020-06-07 ENCOUNTER — Other Ambulatory Visit: Payer: Self-pay | Admitting: Hematology & Oncology

## 2020-06-29 ENCOUNTER — Other Ambulatory Visit: Payer: Self-pay

## 2020-06-29 ENCOUNTER — Other Ambulatory Visit: Payer: 59

## 2020-06-29 DIAGNOSIS — Z20822 Contact with and (suspected) exposure to covid-19: Secondary | ICD-10-CM

## 2020-06-30 LAB — NOVEL CORONAVIRUS, NAA: SARS-CoV-2, NAA: DETECTED — AB

## 2020-06-30 LAB — SARS-COV-2, NAA 2 DAY TAT

## 2020-07-01 ENCOUNTER — Telehealth: Payer: Self-pay | Admitting: Infectious Diseases

## 2020-07-01 NOTE — Telephone Encounter (Signed)
Called to Discuss with patient about Covid symptoms and the use of the monoclonal antibody infusion for those with mild to moderate Covid symptoms and at a high risk of hospitalization.     Pt appears to qualify for this infusion due to co-morbid conditions and/or a member of an at-risk group in accordance with the FDA Emergency Use Authorization.    She has a history of PE/DVT with significant lung damage at that event.  She will consider and call/mychart me back.    Janene Madeira, MSN, NP-C [facility] [email or web address] Pager: [phone] Office: [phone] [place]: [phone]

## 2020-07-04 ENCOUNTER — Ambulatory Visit: Payer: 59 | Admitting: Hematology & Oncology

## 2020-07-04 ENCOUNTER — Other Ambulatory Visit: Payer: 59

## 2020-07-05 ENCOUNTER — Other Ambulatory Visit: Payer: Self-pay | Admitting: Hematology & Oncology

## 2020-07-07 ENCOUNTER — Telehealth: Payer: Self-pay | Admitting: *Deleted

## 2020-07-07 NOTE — Telephone Encounter (Signed)
Patient called to let us know she tested positive for COVID August 18th.  Wanted to know if she can still come for her appt next week with Dr Marin Olp.  Explained to patient the [facility] COVID policy of 21 days.  Patient understands and call routed to scheduler to schedule new appt

## 2020-07-11 ENCOUNTER — Inpatient Hospital Stay: Payer: 59 | Admitting: Hematology & Oncology

## 2020-07-11 ENCOUNTER — Inpatient Hospital Stay: Payer: 59

## 2020-08-07 ENCOUNTER — Other Ambulatory Visit: Payer: Self-pay | Admitting: Hematology & Oncology

## 2020-08-11 ENCOUNTER — Inpatient Hospital Stay (HOSPITAL_BASED_OUTPATIENT_CLINIC_OR_DEPARTMENT_OTHER): Payer: 59 | Admitting: Hematology & Oncology

## 2020-08-11 ENCOUNTER — Inpatient Hospital Stay: Payer: 59 | Attending: Hematology & Oncology

## 2020-08-11 ENCOUNTER — Encounter: Payer: Self-pay | Admitting: Hematology & Oncology

## 2020-08-11 ENCOUNTER — Other Ambulatory Visit: Payer: Self-pay

## 2020-08-11 VITALS — BP 113/58 | HR 84 | Temp 98.6°F | Resp 18 | Ht 69.0 in | Wt 258.1 lb

## 2020-08-11 DIAGNOSIS — I82512 Chronic embolism and thrombosis of left femoral vein: Secondary | ICD-10-CM

## 2020-08-11 DIAGNOSIS — D6851 Activated protein C resistance: Secondary | ICD-10-CM | POA: Diagnosis not present

## 2020-08-11 DIAGNOSIS — Z86711 Personal history of pulmonary embolism: Secondary | ICD-10-CM | POA: Insufficient documentation

## 2020-08-11 DIAGNOSIS — D6859 Other primary thrombophilia: Secondary | ICD-10-CM | POA: Insufficient documentation

## 2020-08-11 DIAGNOSIS — Z86718 Personal history of other venous thrombosis and embolism: Secondary | ICD-10-CM | POA: Insufficient documentation

## 2020-08-11 DIAGNOSIS — Z7901 Long term (current) use of anticoagulants: Secondary | ICD-10-CM | POA: Insufficient documentation

## 2020-08-11 LAB — CBC WITH DIFFERENTIAL (CANCER CENTER ONLY)
Abs Immature Granulocytes: 0.05 10*3/uL (ref 0.00–0.07)
Basophils Absolute: 0 10*3/uL (ref 0.0–0.1)
Basophils Relative: 1 %
Eosinophils Absolute: 0.1 10*3/uL (ref 0.0–0.5)
Eosinophils Relative: 1 %
HCT: 40.1 % (ref 36.0–46.0)
Hemoglobin: 13 g/dL (ref 12.0–15.0)
Immature Granulocytes: 1 %
Lymphocytes Relative: 29 %
Lymphs Abs: 2.5 10*3/uL (ref 0.7–4.0)
MCH: 28 pg (ref 26.0–34.0)
MCHC: 32.4 g/dL (ref 30.0–36.0)
MCV: 86.4 fL (ref 80.0–100.0)
Monocytes Absolute: 0.4 10*3/uL (ref 0.1–1.0)
Monocytes Relative: 5 %
Neutro Abs: 5.4 10*3/uL (ref 1.7–7.7)
Neutrophils Relative %: 63 %
Platelet Count: 264 10*3/uL (ref 150–400)
RBC: 4.64 MIL/uL (ref 3.87–5.11)
RDW: 13.5 % (ref 11.5–15.5)
WBC Count: 8.6 10*3/uL (ref 4.0–10.5)
nRBC: 0 % (ref 0.0–0.2)

## 2020-08-11 LAB — CMP (CANCER CENTER ONLY)
ALT: 14 U/L (ref 0–44)
AST: 15 U/L (ref 15–41)
Albumin: 4.5 g/dL (ref 3.5–5.0)
Alkaline Phosphatase: 50 U/L (ref 38–126)
Anion gap: 8 (ref 5–15)
BUN: 13 mg/dL (ref 6–20)
CO2: 26 mmol/L (ref 22–32)
Calcium: 9.5 mg/dL (ref 8.9–10.3)
Chloride: 105 mmol/L (ref 98–111)
Creatinine: 1 mg/dL (ref 0.44–1.00)
GFR, Est AFR Am: >60 mL/min (ref >60)
GFR, Estimated: >60 mL/min (ref >60)
Glucose, Bld: 137 mg/dL — ABNORMAL HIGH (ref 70–99)
Potassium: 4.1 mmol/L (ref 3.5–5.1)
Sodium: 139 mmol/L (ref 135–145)
Total Bilirubin: 0.5 mg/dL (ref 0.3–1.2)
Total Protein: 7.7 g/dL (ref 6.5–8.1)

## 2020-08-11 NOTE — Progress Notes (Signed)
Hematology and Oncology Follow Up Visit  Heather Lloyd [id number] 1991-06-16 29 y.o. 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 Heather Lloyd is back for a surprise visit.  We have not seen her for about 10 months.  She is having some issues.  I think the biggest issue is her possibly wanting to get pregnant.  She is worried about pregnancy and being on blood thinner.  We talked a lot about this.  She will need to be on Lovenox.  I would dose Lovenox probably twice a day dosing given the amount that we would need.  I told her that she will need Lovenox once we know that she is pregnant.  She will continue Lovenox throughout her pregnancy and then for 6 weeks afterwards.  I told her that we just have not had a woman that has had a problem with blood clots during pregnancy while on Lovenox.  She is also worried about the continued swelling in the left leg.  She does have the May-Thurner syndrome.  We will see if vascular surgery or possibly interventional radiology might be able to help with this.  I will know if a stent can be placed to help open up the iliac vein.  Finally, she is worried about the coronavirus vaccine.  She actually had the COVID back in August.  She was not hospitalized.  She just felt very tired and congested.  She is worried that she will have a blood clot with the COVID vaccine.  I told her that since she is on therapeutic anticoagulation, I would think that a thromboembolic event would be highly unlikely.  I told her that I thought that the benefits of the vaccine outweigh the risk of any complication of the vaccine.  She is still working.  She is trying to lose some weight.  She has had no bleeding.  She has had no rashes.  She does have this area on  the lower part of her left leg on the inside that is a little bit firm.  I will know if this is some kind of superficial vein or not.  We will certainly get a Doppler of her left leg to see how everything looks.  She has had no problems with chest wall pain.  There is been no cough.  She has had no nausea or vomiting.  There is really no shortness of breath.  Overall, her performance status is ECOG 0.   Medications:  Allergies as of 08/11/2020      Reactions   Codeine Hives, Swelling   Ibuprofen Other (See Comments)   Pt is on blood thinners      Medication List       Accurate as of August 11, 2020  6:09 PM. If you have any questions, ask your nurse or doctor.        STOP taking these medications   HYDROmorphone 2 MG tablet Commonly known as: Dilaudid Stopped by: Volanda Napoleon, MD   polyethylene glycol 17 g packet Commonly known as: MIRALAX / GLYCOLAX Stopped by: Volanda Napoleon, MD   promethazine 12.5 MG tablet Commonly known as: PHENERGAN Stopped by: Volanda Napoleon, MD     TAKE these medications   cyclobenzaprine 5 MG tablet Commonly known as: FLEXERIL Take  1 tablet by mouth as needed for cramping.   Eliquis 5 MG Tabs tablet Generic drug: apixaban Take 1 tablet by mouth twice daily   melatonin 5 MG Tabs Take 5 mg by mouth at bedtime as needed (sleep).   multivitamin with minerals Tabs tablet Take 2 tablets by mouth daily.   norethindrone 0.35 MG tablet Commonly known as: MICRONOR Take 1 tablet by mouth at bedtime.       Allergies:  Allergies  Allergen Reactions  . Codeine Hives and Swelling  . Ibuprofen Other (See Comments)    Pt is on blood thinners     Past Medical History, Surgical history, Social history, and Family History were reviewed and updated.  Review of Systems: Review of Systems  Constitutional: Negative.   HENT: Negative.   Eyes: Negative.   Respiratory: Negative.   Cardiovascular: Positive for leg swelling.    Gastrointestinal: Negative.   Genitourinary: Negative.   Musculoskeletal: Negative.   Skin: Negative.   Neurological: Negative.   Endo/Heme/Allergies: Negative.   Psychiatric/Behavioral: Negative.       Physical Exam:  height is 5\' 9"  (1.753 m) and weight is 258 lb 1.9 oz (117.1 kg). Her oral temperature is 98.6 F (37 C). Her blood pressure is 113/58 (abnormal) and her pulse is 84. Her respiration is 18 and oxygen saturation is 100%.   Wt Readings from Last 3 Encounters:  08/11/20 258 lb 1.9 oz (117.1 kg)  05/11/18 272 lb (123.4 kg)  05/05/18 265 lb 4 oz (120.3 kg)   Physical Exam Vitals reviewed.  HENT:     Head: Normocephalic and atraumatic.  Eyes:     Pupils: Pupils are equal, round, and reactive to light.  Cardiovascular:     Rate and Rhythm: Normal rate and regular rhythm.     Heart sounds: Normal heart sounds.  Pulmonary:     Effort: Pulmonary effort is normal.     Breath sounds: Normal breath sounds.  Abdominal:     General: Bowel sounds are normal.     Palpations: Abdomen is soft.  Musculoskeletal:        General: No tenderness or deformity. Normal range of motion.     Cervical back: Normal range of motion.     Comments: Extremities show some chronic mild nonpitting edema of the left leg.  I cannot palpate a venous cord in the left leg.  She does have some small varicose veins in the lower aspect of the left leg.  There is a negative Homans sign.  Her right leg is unremarkable.  Lymphadenopathy:     Cervical: No cervical adenopathy.  Skin:    General: Skin is warm and dry.     Findings: No erythema or rash.  Neurological:     Mental Status: She is alert and oriented to person, place, and time.  Psychiatric:        Behavior: Behavior normal.        Thought Content: Thought content normal.        Judgment: Judgment normal.     Lab Results  Component Value Date   WBC 8.6 08/11/2020   HGB 13.0 08/11/2020   HCT 40.1 08/11/2020   MCV 86.4 08/11/2020    PLT 264 08/11/2020   No results found for: FERRITIN, IRON, TIBC, UIBC, IRONPCTSAT Lab Results  Component Value Date   RETICCTPCT 2.24 (H) 04/25/2017   RBC 4.64 08/11/2020   RETICCTABS 105.73 (H) 04/25/2017   No results found for: KPAFRELGTCHN, LAMBDASER, KAPLAMBRATIO No  results found for: IGGSERUM, IGA, IGMSERUM No results found for: Odetta Pink, SPEI   Chemistry      Component Value Date/Time   NA 139 08/11/2020 1351   NA 140 04/25/2017 1550   K 4.1 08/11/2020 1351   K 3.8 04/25/2017 1550   CL 105 08/11/2020 1351   CO2 26 08/11/2020 1351   CO2 26 04/25/2017 1550   BUN 13 08/11/2020 1351   BUN 12.5 04/25/2017 1550   CREATININE 1.00 08/11/2020 1351   CREATININE 1.0 04/25/2017 1550      Component Value Date/Time   CALCIUM 9.5 08/11/2020 1351   CALCIUM 9.6 04/25/2017 1550   ALKPHOS 50 08/11/2020 1351   ALKPHOS 60 04/25/2017 1550   AST 15 08/11/2020 1351   AST 23 04/25/2017 1550   ALT 14 08/11/2020 1351   ALT 28 04/25/2017 1550   BILITOT 0.5 08/11/2020 1351   BILITOT 0.48 04/25/2017 1550       Impression and Plan: Ms. 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    Again, we will see if vascular surgery or maybe interventional radiology can help with the iliac vein given that she has the May-Thurner syndrome.  I am not sure what they could do.  I do not see a problem with Ms. Silman being pregnant.  I just told her to let us know when she was pregnant so we can get her in and switch her over to Lovenox.  Hopefully she will get the coronavirus vaccine.  I really think that it would be safe for her to get this.  It was nice to see her again.  She is a lot of fun to talk to.  She is so nice.  She is quite eloquent.  We just  want her to have a good quality of life.  She will come back when she lets [place] know about her pregnancy. She is doing well on Eliquis and so far there has been no evidence of recurrence.  She will continue to consider treatment for the May-Thurner syndrome and will let us know if she changes her mind.  We will continue to discuss her plan of care during pregnancy and postpartum.  We will see her back in another 4 months for follow-up.  She will contact our office with any questions or concerns. We can certainly see her sooner if needed.   Volanda Napoleon, MD 9/30/20216:09 PM

## 2020-08-12 ENCOUNTER — Telehealth: Payer: Self-pay | Admitting: Hematology & Oncology

## 2020-08-12 NOTE — Telephone Encounter (Signed)
Per 9/30 los Patient will let us know when she is pregnant for her next appointments MD-30 MIN APPT & LABS

## 2020-08-17 ENCOUNTER — Ambulatory Visit
Admission: RE | Admit: 2020-08-17 | Discharge: 2020-08-17 | Disposition: A | Discharge status: Home / Self Care | Payer: 59 | Source: Ambulatory Visit | Attending: Hematology & Oncology | Admitting: Hematology & Oncology

## 2020-08-17 DIAGNOSIS — I82512 Chronic embolism and thrombosis of left femoral vein: Secondary | ICD-10-CM

## 2020-08-22 ENCOUNTER — Other Ambulatory Visit: Payer: Self-pay | Admitting: *Deleted

## 2020-08-22 ENCOUNTER — Encounter: Payer: Self-pay | Admitting: *Deleted

## 2020-08-22 DIAGNOSIS — I82512 Chronic embolism and thrombosis of left femoral vein: Secondary | ICD-10-CM

## 2020-09-03 ENCOUNTER — Other Ambulatory Visit: Payer: Self-pay | Admitting: Hematology & Oncology

## 2020-09-13 ENCOUNTER — Telehealth: Payer: Self-pay | Admitting: *Deleted

## 2020-09-13 NOTE — Telephone Encounter (Signed)
This nurse called patient back regarding referral to Vascular and Ultrasound results. Instructed her to call the office back tomorrow.

## 2020-09-27 ENCOUNTER — Telehealth: Payer: Self-pay | Admitting: *Deleted

## 2020-09-27 NOTE — Telephone Encounter (Addendum)
Message received from patient requesting a call back regarding doppler results from 08/17/20.  Call placed back to patient and patient given results from doppler.  Pt states that she has an appt with Dr. Oneida Alar this week with Vascular and plans to keep that appointment.  Pt appreciative of call back and has no further questions or concerns at this time.

## 2020-09-28 ENCOUNTER — Telehealth: Payer: Self-pay | Admitting: *Deleted

## 2020-09-28 NOTE — Telephone Encounter (Signed)
Opened in error

## 2020-09-29 ENCOUNTER — Ambulatory Visit: Payer: 59 | Admitting: Vascular Surgery

## 2020-09-29 ENCOUNTER — Other Ambulatory Visit: Payer: Self-pay

## 2020-09-29 ENCOUNTER — Encounter: Payer: Self-pay | Admitting: Vascular Surgery

## 2020-09-29 VITALS — BP 118/71 | HR 69 | Temp 98.4°F | Resp 20 | Ht 69.0 in | Wt 258.0 lb

## 2020-09-29 DIAGNOSIS — I82522 Chronic embolism and thrombosis of left iliac vein: Secondary | ICD-10-CM | POA: Diagnosis not present

## 2020-09-29 NOTE — Progress Notes (Signed)
Referring Physician: Dr. Marin Olp  Patient name: Heather Lloyd MRN: [id number] DOB: 1991-10-31 Sex: female  REASON FOR CONSULT: Left iliac vein occlusion  HPI: Heather Lloyd is a 29 y.o. 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  Approximately 10 years ago she had extensive DVT in her left lower extremity.  This was treated with oral anticoagulation.  She has minimal swelling symptoms in her left leg at this point.  She does intermittently wear compression stockings.  She states she is not really bothered by her symptoms in her left leg.  She is considering getting pregnant in the near future.  When she was recently seen by her hematologist he thought she needed vascular evaluation regarding her prior iliac vein occlusion.  She is currently on Eliquis.  She did have a pulmonary embolus at the time of her prior DVT.     Past Medical History:  Diagnosis Date  . Antithrombin III deficiency ([place])   . Constipation    mild  . Diverticulitis    no current problem  . DVT (deep venous thrombosis) ([employer]) 2009   [place] - femoral vein of left lower extremity  . Factor V Leiden ([place])   . [facility] of blood transfusion 2009   [place] [facility]  . Ovarian cyst   . Pulmonary embolism ([place]) 2009   surgery to remove clot from left lung   Past Surgical History:  Procedure Laterality Date  . ADENOIDECTOMY    . LAPAROSCOPIC OVARIAN CYSTECTOMY Right 05/09/2018   Procedure: LAPAROSCOPIC OVARIAN CYSTECTOMY;  Surgeon: Janyth Pupa, DO;  Location: [facility];  Service: Gynecology;  Laterality: Right;  . OOPHORECTOMY Right 05/09/2018   Procedure: OOPHORECTOMY;  Surgeon: Janyth Pupa, DO;  Location: [facility];  Service: Gynecology;  Laterality: Right;  . THROMBECTOMY  2009   10 yrs ago in [place]  . TONSILLECTOMY AND ADENOIDECTOMY    . WISDOM TOOTH EXTRACTION      Family History  Problem Relation Age of Onset  . Factor V Leiden deficiency Mother    . Diabetes Maternal Grandmother   . Diabetes Maternal Grandfather   . Hypertension Maternal Grandfather     SOCIAL HISTORY: Social History   Socioeconomic History  . Marital status: Married    Spouse name: Not on file  . Number of children: Not on file  . Years of education: Not on file  . Highest education level: Not on file  Occupational History  . Not on file  Tobacco Use  . Smoking status: Never Smoker  . Smokeless tobacco: Never Used  Vaping Use  . Vaping Use: Never used  Substance and Sexual Activity  . Alcohol use: Yes    Alcohol/week: 3.0 standard drinks    Types: 2 Glasses of wine, 1 Shots of liquor per week  . Drug use: No  . Sexual activity: Yes    Birth control/protection: Pill  Other Topics Concern  . Not on file  Social History Narrative  . Not on file   Social Determinants of Health   Financial Resource Strain:   . Difficulty of Paying Living Expenses: Not on file  Food Insecurity:   . Worried About Charity fundraiser in the Last Year: Not on file  . Ran Out of Food in the Last Year: Not on file  Transportation Needs:   . Lack of Transportation (Medical): Not on file  . Lack of Transportation (Non-Medical): Not on file  Physical  Activity:   . Days of Exercise per Week: Not on file  . Minutes of Exercise per Session: Not on file  Stress:   . Feeling of Stress : Not on file  Social Connections:   . Frequency of Communication with Friends and Family: Not on file  . Frequency of Social Gatherings with Friends and Family: Not on file  . Attends Religious Services: Not on file  . Active Member of Clubs or Organizations: Not on file  . Attends Archivist Meetings: Not on file  . Marital Status: Not on file  Intimate Partner Violence:   . Fear of Current or Ex-Partner: Not on file  . Emotionally Abused: Not on file  . Physically Abused: Not on file  . Sexually Abused: Not on file    Allergies  Allergen Reactions  . Codeine Hives and  Swelling  . Ibuprofen Other (See Comments)    Pt is on blood thinners     Current Outpatient Medications  Medication Sig Dispense Refill  . cyclobenzaprine (FLEXERIL) 5 MG tablet Take 1 tablet by mouth as needed for cramping.    Marland Kitchen ELIQUIS 5 MG TABS tablet Take 1 tablet by mouth twice daily 60 tablet 0  . Melatonin 5 MG TABS Take 5 mg by mouth at bedtime as needed (sleep).    . Multiple Vitamin (MULTIVITAMIN WITH MINERALS) TABS tablet Take 2 tablets by mouth daily.    . norethindrone (MICRONOR,CAMILA,ERRIN) 0.35 MG tablet Take 1 tablet by mouth at bedtime.      No current facility-administered medications for this visit.    ROS:   General:  No weight loss, Fever, chills  HEENT: No recent headaches, no nasal bleeding, no visual changes, no sore throat  Neurologic: No dizziness, blackouts, seizures. No recent symptoms of stroke or mini- stroke. No recent episodes of slurred speech, or temporary blindness.  Cardiac: No recent episodes of chest pain/pressure, no shortness of breath at rest.  No shortness of breath with exertion.  Denies history of atrial fibrillation or irregular heartbeat  Vascular: No history of rest pain in feet.  No history of claudication.  No history of non-healing ulcer, + history of DVT   Pulmonary: No home oxygen, no productive cough, no hemoptysis,  No asthma or wheezing  Musculoskeletal:  [ ]  Arthritis, [ ]  Low back pain,  [ ]  Joint pain  Hematologic:No history of hypercoagulable state.  No history of easy bleeding.  No history of anemia  Gastrointestinal: No hematochezia or melena,  No gastroesophageal reflux, no trouble swallowing  Urinary: [ ]  chronic Kidney disease, [ ]  on HD - [ ]  MWF or [ ]  TTHS, [ ]  Burning with urination, [ ]  Frequent urination, [ ]  Difficulty urinating;   Skin: No rashes  Psychological: No history of anxiety,  No history of depression   Physical Examination  Vitals:   09/29/20 0909  BP: 118/71  Pulse: 69  Resp: 20    Temp: 98.4 F (36.9 C)  SpO2: 97%  Weight: 258 lb (117 kg)  Height: 5\' 9"  (1.753 m)    Body mass index is 38.1 kg/m.  General:  Alert and oriented, no acute distress HEENT: Normal Neck: No JVD Cardiac: Regular Rate and Rhythm Abdomen: Soft, non-tender, non-distended, no mass, well-healed midline laparotomy scar Skin: No rash Extremity Pulses:  2+ radial, brachial, femoral, dorsalis pedis, posterior tibial pulses bilaterally Musculoskeletal: No deformity trace left lower extremity edema only slightly larger in appearance than the right leg  Neurologic:  Upper and lower extremity motor 5/5 and symmetric  DATA: Patient had a DVT ultrasound in October of this year.  Showed chronic changes in the left deep femoral vein and wall thickening of the common femoral vein and popliteal vein no evidence of acute DVT  Also reviewed the images of her prior CT scan from 2019 which shows abundant collaterals across the abdominal wall several of which are quite large suggestive of iliac vein occlusion.  ASSESSMENT: Chronic left iliac vein occlusion.  Usually these are not very amenable to stent placement with limited durability.  The patient has developed significant collaterals and has minimal symptoms in her left leg at this point.  I do not believe an intervention is warranted and certainly the benefit does not outweigh the risk.  I do not believe the patient has any contraindication to pregnancy.  I did discuss with her today that she should let her obstetrician know about the large abdominal wall collaterals in the event that she had a C-section there could be bleeding from this but also disruption of these collaterals could make her left lower extremity swelling worse.  I also would not consider an iliac vein stent if she is considering pregnancy in the near future as this would certainly place additional stress on iliac venous stent with the weight of the fetus and again potentially lead to decreased  durability and potentially increased leg swelling   PLAN: Patient will continue to wear her compression stockings to control left leg swelling symptoms.  She will follow up on an as-needed basis.  Ruta Hinds, MD [facility] Office: [phone]

## 2020-10-17 ENCOUNTER — Other Ambulatory Visit: Payer: Self-pay | Admitting: Hematology & Oncology

## 2020-11-14 ENCOUNTER — Other Ambulatory Visit: Payer: Self-pay | Admitting: Hematology & Oncology

## 2020-12-16 ENCOUNTER — Other Ambulatory Visit: Payer: Self-pay | Admitting: Hematology & Oncology

## 2021-01-02 ENCOUNTER — Ambulatory Visit
Admission: RE | Admit: 2021-01-02 | Discharge: 2021-01-02 | Disposition: A | Discharge status: Home / Self Care | Payer: 59 | Source: Ambulatory Visit | Attending: Internal Medicine | Admitting: Internal Medicine

## 2021-01-02 ENCOUNTER — Other Ambulatory Visit: Payer: Self-pay | Admitting: Internal Medicine

## 2021-01-02 DIAGNOSIS — R0600 Dyspnea, unspecified: Secondary | ICD-10-CM

## 2021-01-16 ENCOUNTER — Other Ambulatory Visit: Payer: Self-pay | Admitting: Hematology & Oncology

## 2021-02-14 ENCOUNTER — Other Ambulatory Visit: Payer: Self-pay | Admitting: Hematology & Oncology

## 2021-03-17 ENCOUNTER — Other Ambulatory Visit: Payer: Self-pay | Admitting: Hematology & Oncology

## 2021-04-17 ENCOUNTER — Other Ambulatory Visit: Payer: Self-pay | Admitting: Hematology & Oncology

## 2021-05-01 ENCOUNTER — Inpatient Hospital Stay: Payer: 59 | Admitting: Hematology & Oncology

## 2021-05-01 ENCOUNTER — Telehealth: Payer: Self-pay | Admitting: *Deleted

## 2021-05-01 ENCOUNTER — Other Ambulatory Visit: Payer: Self-pay

## 2021-05-01 ENCOUNTER — Encounter: Payer: Self-pay | Admitting: Hematology & Oncology

## 2021-05-01 ENCOUNTER — Inpatient Hospital Stay: Payer: 59 | Attending: Hematology & Oncology

## 2021-05-01 VITALS — BP 100/59 | HR 64 | Temp 98.0°F | Resp 17 | Wt 263.0 lb

## 2021-05-01 DIAGNOSIS — I87009 Postthrombotic syndrome without complications of unspecified extremity: Secondary | ICD-10-CM | POA: Diagnosis not present

## 2021-05-01 DIAGNOSIS — I871 Compression of vein: Secondary | ICD-10-CM | POA: Diagnosis present

## 2021-05-01 DIAGNOSIS — I82512 Chronic embolism and thrombosis of left femoral vein: Secondary | ICD-10-CM

## 2021-05-01 DIAGNOSIS — Z7901 Long term (current) use of anticoagulants: Secondary | ICD-10-CM | POA: Insufficient documentation

## 2021-05-01 DIAGNOSIS — Z86711 Personal history of pulmonary embolism: Secondary | ICD-10-CM | POA: Insufficient documentation

## 2021-05-01 DIAGNOSIS — Z86718 Personal history of other venous thrombosis and embolism: Secondary | ICD-10-CM | POA: Diagnosis not present

## 2021-05-01 DIAGNOSIS — D6851 Activated protein C resistance: Secondary | ICD-10-CM | POA: Insufficient documentation

## 2021-05-01 DIAGNOSIS — Z8616 Personal history of COVID-19: Secondary | ICD-10-CM | POA: Insufficient documentation

## 2021-05-01 LAB — LACTATE DEHYDROGENASE: LDH: 121 U/L (ref 98–192)

## 2021-05-01 LAB — CMP (CANCER CENTER ONLY)
ALT: 20 U/L (ref 0–44)
AST: 15 U/L (ref 15–41)
Albumin: 4.3 g/dL (ref 3.5–5.0)
Alkaline Phosphatase: 39 U/L (ref 38–126)
Anion gap: 9 (ref 5–15)
BUN: 13 mg/dL (ref 6–20)
CO2: 22 mmol/L (ref 22–32)
Calcium: 9.3 mg/dL (ref 8.9–10.3)
Chloride: 106 mmol/L (ref 98–111)
Creatinine: 0.9 mg/dL (ref 0.44–1.00)
GFR, Estimated: >60 mL/min (ref >60)
Glucose, Bld: 108 mg/dL — ABNORMAL HIGH (ref 70–99)
Potassium: 3.8 mmol/L (ref 3.5–5.1)
Sodium: 137 mmol/L (ref 135–145)
Total Bilirubin: 0.3 mg/dL (ref 0.3–1.2)
Total Protein: 6.9 g/dL (ref 6.5–8.1)

## 2021-05-01 LAB — CBC WITH DIFFERENTIAL (CANCER CENTER ONLY)
Abs Immature Granulocytes: 0.06 10*3/uL (ref 0.00–0.07)
Basophils Absolute: 0.1 10*3/uL (ref 0.0–0.1)
Basophils Relative: 1 %
Eosinophils Absolute: 0.1 10*3/uL (ref 0.0–0.5)
Eosinophils Relative: 1 %
HCT: 35.1 % — ABNORMAL LOW (ref 36.0–46.0)
Hemoglobin: 11.9 g/dL — ABNORMAL LOW (ref 12.0–15.0)
Immature Granulocytes: 1 %
Lymphocytes Relative: 24 %
Lymphs Abs: 2.2 10*3/uL (ref 0.7–4.0)
MCH: 29.2 pg (ref 26.0–34.0)
MCHC: 33.9 g/dL (ref 30.0–36.0)
MCV: 86 fL (ref 80.0–100.0)
Monocytes Absolute: 0.6 10*3/uL (ref 0.1–1.0)
Monocytes Relative: 7 %
Neutro Abs: 6.2 10*3/uL (ref 1.7–7.7)
Neutrophils Relative %: 66 %
Platelet Count: 234 10*3/uL (ref 150–400)
RBC: 4.08 MIL/uL (ref 3.87–5.11)
RDW: 13.4 % (ref 11.5–15.5)
WBC Count: 9.2 10*3/uL (ref 4.0–10.5)
nRBC: 0 % (ref 0.0–0.2)

## 2021-05-01 LAB — HCG, SERUM, QUALITATIVE: Preg, Serum: POSITIVE — AB

## 2021-05-01 NOTE — Progress Notes (Signed)
Hematology and Oncology Follow Up Visit  Heather Lloyd [id number] [birth year] 30 y.o. 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  We have not had seen her because she is has been doing well.  The issue now that she might be pregnant.  She did a home pregnancy test.  It was positive.  She sees her gynecologist this afternoon to have blood work done to see if she truly is pregnant.  She has had no problems on the Eliquis.  She is done quite nicely with the Eliquis.  A little bit worried about being on Lovenox.  I know she apparently has had a difficult time with Lovenox in the past.  I am not sure this was just not dosed properly.  If she is pregnant, we will have to get her on twice daily Lovenox because of her weight.  I think she is going to need therapeutic Lovenox given her history.  She has had no problems with rashes.  There is been no bleeding.  Is been no change in bowel or bladder habits.  She has had no issues with cough or shortness of breath.  She did have COVID back in August.  This was after a Stage manager in [place], [place].  She was not hospitalized.  She lost her taste and smell.  She is quite tired.  There has been no problems with fever right now..  She does have some abdominal cramps.  Her last Doppler was done back in October 2021.  There is no acute DVT in the left lower leg.  She had a chronic DVT with that was nonocclusive in the left common, deep and superficial femoral veins.  Overall, I would say her performance status is ECOG 1.    Medications:  Allergies as of 05/01/2021       Reactions   Codeine Hives, Swelling   Ibuprofen Other (See Comments)   Pt is on blood thinners         Medication List        Accurate as of May 01, 2021  2:46 PM. If you have any questions, ask your nurse or doctor.          cyclobenzaprine 5 MG tablet Commonly known as: FLEXERIL Take 1 tablet by mouth as needed for cramping.   Eliquis 5 MG Tabs tablet Generic drug: apixaban Take 1 tablet by mouth twice daily   melatonin 5 MG Tabs Take 5 mg by mouth at bedtime as needed (sleep).   multivitamin with minerals Tabs tablet Take 2 tablets by mouth daily.   norethindrone 0.35 MG tablet Commonly known as: MICRONOR Take 1 tablet by mouth at bedtime.        Allergies:  Allergies  Allergen Reactions   Codeine Hives and Swelling   Ibuprofen Other (See Comments)    Pt is on blood thinners     Past Medical History, Surgical history, Social history, and Family History were reviewed and updated.  Review of Systems: Review of Systems  Constitutional: Negative.   HENT: Negative.    Eyes: Negative.   Respiratory: Negative.    Cardiovascular:  Positive for leg swelling.  Gastrointestinal: Negative.   Genitourinary: Negative.   Musculoskeletal: Negative.   Skin: Negative.  Neurological: Negative.   Endo/Heme/Allergies: Negative.   Psychiatric/Behavioral: Negative.       Physical Exam:  vitals were not taken for this visit.   Wt Readings from Last 3 Encounters:  09/29/20 258 lb (117 kg)  08/11/20 258 lb 1.9 oz (117.1 kg)  05/11/18 272 lb (123.4 kg)   Physical Exam Vitals reviewed.  HENT:     Head: Normocephalic and atraumatic.  Eyes:     Pupils: Pupils are equal, round, and reactive to light.  Cardiovascular:     Rate and Rhythm: Normal rate and regular rhythm.     Heart sounds: Normal heart sounds.  Pulmonary:     Effort: Pulmonary effort is normal.     Breath sounds: Normal breath sounds.  Abdominal:     General: Bowel sounds are normal.     Palpations: Abdomen is soft.  Musculoskeletal:        General: No tenderness or deformity. Normal range  of motion.     Cervical back: Normal range of motion.     Comments: Extremities show some chronic mild nonpitting edema of the left leg.  I cannot palpate a venous cord in the left leg.  She does have some small varicose veins in the lower aspect of the left leg.  There is a negative Homans sign.  Her right leg is unremarkable.  Lymphadenopathy:     Cervical: No cervical adenopathy.  Skin:    General: Skin is warm and dry.     Findings: No erythema or rash.  Neurological:     Mental Status: She is alert and oriented to person, place, and time.  Psychiatric:        Behavior: Behavior normal.        Thought Content: Thought content normal.        Judgment: Judgment normal.    Lab Results  Component Value Date   WBC 9.2 05/01/2021   HGB 11.9 (L) 05/01/2021   HCT 35.1 (L) 05/01/2021   MCV 86.0 05/01/2021   PLT 234 05/01/2021   No results found for: FERRITIN, IRON, TIBC, UIBC, IRONPCTSAT Lab Results  Component Value Date   RETICCTPCT 2.24 (H) 04/25/2017   RBC 4.08 05/01/2021   RETICCTABS 105.73 (H) 04/25/2017   No results found for: KPAFRELGTCHN, LAMBDASER, KAPLAMBRATIO No results found for: IGGSERUM, IGA, IGMSERUM No results found for: Odetta Pink, SPEI   Chemistry      Component Value Date/Time   NA 139 08/11/2020 1351   NA 140 04/25/2017 1550   K 4.1 08/11/2020 1351   K 3.8 04/25/2017 1550   CL 105 08/11/2020 1351   CO2 26 08/11/2020 1351   CO2 26 04/25/2017 1550   BUN 13 08/11/2020 1351   BUN 12.5 04/25/2017 1550   CREATININE 1.00 08/11/2020 1351   CREATININE 1.0 04/25/2017 1550      Component Value Date/Time   CALCIUM 9.5 08/11/2020 1351   CALCIUM 9.6 04/25/2017 1550   ALKPHOS 50 08/11/2020 1351   ALKPHOS 60 04/25/2017 1550   AST 15 08/11/2020 1351   AST 23 04/25/2017 1550   ALT 14 08/11/2020 1351   ALT 28 04/25/2017 1550   BILITOT 0.5 08/11/2020 1351   BILITOT 0.48 04/25/2017 1550       Impression  and Plan: Heather Lloyd is a very pleasant 30 yo caucasian female with history of left lower extremity DVT and bilateral pulmonary emboli, factor V Leiden mutation-heterozygous, antithrombin III deficiency and 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    Again, we will see if she truly is pregnant.  She is, this to be her first pregnancy.  I be very happy for if she was pregnant.  Her weight is 119 kg.  As such, we probably would not use 120 mg twice daily for her Lovenox.  She will let us know if she is pregnant.  She will call the office.  I would like to believe that we will get her through pregnancy without any problems with therapeutic Lovenox.  I am just excited for her.  I know that she is a little bit anxious and nervous about the Lovenox.  I just really try to reassure her that she is going to do okay on the Lovenox.  I know she is done Lovenox before.  I am pretty sure she is pretty skilled and given herself Lovenox.  We will find out about her pregnancy.  We will then make adjustments with her protocol if we have to with Lovenox.    Volanda Napoleon, MD 6/20/20222:46 PM

## 2021-05-01 NOTE — Telephone Encounter (Signed)
Call received from patient stating that she took a home pregnancy test and would like to know what she needs to do to change from Eliquis to Lovenox.  Dr. Marin Olp notified and would like for pt to come in today to be seen.  Message sent to scheduling and pt notified.

## 2021-05-01 NOTE — Telephone Encounter (Signed)
Per scheduling message Heather Lloyd - scheduled appointment with Dr. Marin Olp for 05/01/21 @ 2:00 p.m.

## 2021-05-02 ENCOUNTER — Telehealth: Payer: Self-pay

## 2021-05-02 ENCOUNTER — Telehealth: Payer: Self-pay | Admitting: *Deleted

## 2021-05-02 MED ORDER — ENOXAPARIN SODIUM 120 MG/0.8ML IJ SOSY: 120.0000 mg | PREFILLED_SYRINGE | Freq: Two times a day (BID) | INTRAMUSCULAR | 3 refills | Status: DC

## 2021-05-02 NOTE — Telephone Encounter (Signed)
Call received from patient to notify Dr. Marin Olp that her pregnancy test with her OB/GYN was positive yesterday and that she will need to start on Lovenox.  Dr. Marin Olp notified.  Order received for patient to start on Lovenox 120 mg BID. Patient notified per order of Dr. Marin Olp that she will start on Lovenox 120 mg BID. Pt will come to office today to learn how to self inject Lovenox.

## 2021-05-02 NOTE — Telephone Encounter (Signed)
No 05/01/21 LOS noted    Heather Lloyd

## 2021-05-24 LAB — HEPATITIS C ANTIBODY: HCV Ab: NEGATIVE

## 2021-05-24 LAB — OB RESULTS CONSOLE RPR: RPR: NONREACTIVE

## 2021-05-24 LAB — OB RESULTS CONSOLE ABO/RH: RH Type: POSITIVE

## 2021-05-24 LAB — OB RESULTS CONSOLE GC/CHLAMYDIA
Chlamydia: NEGATIVE
Gonorrhea: NEGATIVE

## 2021-05-24 LAB — OB RESULTS CONSOLE HEPATITIS B SURFACE ANTIGEN: Hepatitis B Surface Ag: NEGATIVE

## 2021-05-24 LAB — OB RESULTS CONSOLE RUBELLA ANTIBODY, IGM: Rubella: IMMUNE

## 2021-05-24 LAB — OB RESULTS CONSOLE HIV ANTIBODY (ROUTINE TESTING): HIV: NONREACTIVE

## 2021-05-24 LAB — OB RESULTS CONSOLE ANTIBODY SCREEN: Antibody Screen: NEGATIVE

## 2021-06-08 ENCOUNTER — Other Ambulatory Visit: Payer: Self-pay

## 2021-06-08 ENCOUNTER — Inpatient Hospital Stay: Payer: 59 | Attending: Hematology & Oncology

## 2021-06-08 ENCOUNTER — Telehealth: Payer: Self-pay

## 2021-06-08 ENCOUNTER — Inpatient Hospital Stay: Payer: 59 | Admitting: Family

## 2021-06-08 ENCOUNTER — Encounter: Payer: Self-pay | Admitting: Family

## 2021-06-08 VITALS — BP 116/66 | HR 80 | Temp 98.3°F | Resp 17 | Wt 261.4 lb

## 2021-06-08 DIAGNOSIS — Z86718 Personal history of other venous thrombosis and embolism: Secondary | ICD-10-CM | POA: Diagnosis not present

## 2021-06-08 DIAGNOSIS — I871 Compression of vein: Secondary | ICD-10-CM | POA: Insufficient documentation

## 2021-06-08 DIAGNOSIS — I82512 Chronic embolism and thrombosis of left femoral vein: Secondary | ICD-10-CM | POA: Diagnosis not present

## 2021-06-08 DIAGNOSIS — Z86711 Personal history of pulmonary embolism: Secondary | ICD-10-CM | POA: Diagnosis not present

## 2021-06-08 DIAGNOSIS — D6851 Activated protein C resistance: Secondary | ICD-10-CM | POA: Insufficient documentation

## 2021-06-08 LAB — CBC WITH DIFFERENTIAL (CANCER CENTER ONLY)
Abs Immature Granulocytes: 0.09 10*3/uL — ABNORMAL HIGH (ref 0.00–0.07)
Basophils Absolute: 0 10*3/uL (ref 0.0–0.1)
Basophils Relative: 1 %
Eosinophils Absolute: 0.1 10*3/uL (ref 0.0–0.5)
Eosinophils Relative: 1 %
HCT: 34.4 % — ABNORMAL LOW (ref 36.0–46.0)
Hemoglobin: 11.8 g/dL — ABNORMAL LOW (ref 12.0–15.0)
Immature Granulocytes: 1 %
Lymphocytes Relative: 25 %
Lymphs Abs: 2.1 10*3/uL (ref 0.7–4.0)
MCH: 29.1 pg (ref 26.0–34.0)
MCHC: 34.3 g/dL (ref 30.0–36.0)
MCV: 84.7 fL (ref 80.0–100.0)
Monocytes Absolute: 0.5 10*3/uL (ref 0.1–1.0)
Monocytes Relative: 6 %
Neutro Abs: 5.4 10*3/uL (ref 1.7–7.7)
Neutrophils Relative %: 66 %
Platelet Count: 206 10*3/uL (ref 150–400)
RBC: 4.06 MIL/uL (ref 3.87–5.11)
RDW: 13 % (ref 11.5–15.5)
WBC Count: 8.3 10*3/uL (ref 4.0–10.5)
nRBC: 0 % (ref 0.0–0.2)

## 2021-06-08 LAB — CMP (CANCER CENTER ONLY)
ALT: 17 U/L (ref 0–44)
AST: 11 U/L — ABNORMAL LOW (ref 15–41)
Albumin: 4.2 g/dL (ref 3.5–5.0)
Alkaline Phosphatase: 41 U/L (ref 38–126)
Anion gap: 9 (ref 5–15)
BUN: 10 mg/dL (ref 6–20)
CO2: 23 mmol/L (ref 22–32)
Calcium: 9.5 mg/dL (ref 8.9–10.3)
Chloride: 104 mmol/L (ref 98–111)
Creatinine: 0.72 mg/dL (ref 0.44–1.00)
GFR, Estimated: >60 mL/min (ref >60)
Glucose, Bld: 100 mg/dL — ABNORMAL HIGH (ref 70–99)
Potassium: 3.9 mmol/L (ref 3.5–5.1)
Sodium: 136 mmol/L (ref 135–145)
Total Bilirubin: 0.3 mg/dL (ref 0.3–1.2)
Total Protein: 7.2 g/dL (ref 6.5–8.1)

## 2021-06-08 LAB — ANTITHROMBIN III: AntiThromb III Func: 56 % — ABNORMAL LOW (ref 75–120)

## 2021-06-08 NOTE — Progress Notes (Signed)
Hematology and Oncology Follow Up Visit  Heather Lloyd [id number] 02-Sep-1991 30 y.o. 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 She is doing well on Lovenox and has only had a little bruising at the injection sites.  No blood loss noted. No petechiae.  She has had some nausea but states she has managed this effectively so far with food and fluids.  No fever, chills, cough, rash, dizziness, SOB, chest pain, palpitations or changes in bowel or bladder habits.  No tenderness, numbness or tingling in her extremities at this time.  She wears her compression stocking daily on the left lower extremity for added support.  No falls or syncope to report.  She is eating well and doing her best to stay well hydrated her weight is stable at 261 lbs.   ECOG Performance Status: 1 - Symptomatic but completely ambulatory  Medications:  Allergies as of 06/08/2021       Reactions   Codeine Hives, Swelling   Ibuprofen Other (See Comments)   Pt is on blood thinners        Medication List        Accurate as of June 08, 2021 10:30 AM. If you have any questions, ask your nurse or doctor.          cyclobenzaprine 5 MG tablet Commonly known as: FLEXERIL Take 10 tablets by mouth as needed for cramping.   cyclobenzaprine 10 MG tablet Commonly known as: FLEXERIL Take 10 mg by mouth at bedtime as needed.   Eliquis 5 MG Tabs tablet Generic drug: apixaban Take 1 tablet by mouth twice daily   enoxaparin 120 MG/0.8ML injection Commonly known as: LOVENOX Inject 0.8 mLs (120 mg total) into the skin every 12 (twelve) hours.   melatonin 5 MG Tabs Take 5 mg by mouth at bedtime as needed (sleep).    multivitamin with minerals Tabs tablet Take 2 tablets by mouth daily.   norethindrone 0.35 MG tablet Commonly known as: MICRONOR Take 1 tablet by mouth at bedtime.   polyethylene glycol 17 g packet Commonly known as: MIRALAX / GLYCOLAX Take 17 g by mouth daily as needed.   prenatal multivitamin Tabs tablet Take 1 tablet by mouth daily at 12 noon.        Allergies:  Allergies  Allergen Reactions   Codeine Hives and Swelling   Ibuprofen Other (See Comments)    Pt is on blood thinners     Past Medical History, Surgical history, Social history, and Family History were reviewed and updated.  Review of Systems: All other 10 point review of systems is negative.   Physical Exam:  weight is 261 lb 6.4 oz (118.6 kg). Her oral temperature is 98.3 F (36.8 C). Her blood pressure is 116/66 and her pulse is 80. Her respiration is 17.   Wt Readings from Last 3 Encounters:  06/08/21 261 lb 6.4 oz (118.6 kg)  05/01/21 263 lb (119.3 kg)  09/29/20 258 lb (117 kg)    Ocular: Sclerae unicteric, pupils equal, round and reactive to light Ear-nose-throat: Oropharynx clear, dentition fair Lymphatic: No cervical or supraclavicular adenopathy Lungs no rales or rhonchi, good excursion bilaterally Heart regular rate and rhythm, no murmur appreciated Abd  soft, nontender, positive bowel sounds MSK no focal spinal tenderness, no joint edema Neuro: non-focal, well-oriented, appropriate affect Breasts: Deferred   Lab Results  Component Value Date   WBC 8.3 06/08/2021   HGB 11.8 (L) 06/08/2021   HCT 34.4 (L) 06/08/2021   MCV 84.7 06/08/2021   PLT 206 06/08/2021   No results found for: FERRITIN, IRON, TIBC, UIBC, IRONPCTSAT Lab Results  Component Value Date   RETICCTPCT 2.24 (H) 04/25/2017   RBC 4.06 06/08/2021   RETICCTABS 105.73 (H) 04/25/2017   No results found for: KPAFRELGTCHN, LAMBDASER, KAPLAMBRATIO No results found for: IGGSERUM, IGA, IGMSERUM No results found for:  Odetta Pink, SPEI   Chemistry      Component Value Date/Time   NA 137 05/01/2021 1411   NA 140 04/25/2017 1550   K 3.8 05/01/2021 1411   K 3.8 04/25/2017 1550   CL 106 05/01/2021 1411   CO2 22 05/01/2021 1411   CO2 26 04/25/2017 1550   BUN 13 05/01/2021 1411   BUN 12.5 04/25/2017 1550   CREATININE 0.90 05/01/2021 1411   CREATININE 1.0 04/25/2017 1550      Component Value Date/Time   CALCIUM 9.3 05/01/2021 1411   CALCIUM 9.6 04/25/2017 1550   ALKPHOS 39 05/01/2021 1411   ALKPHOS 60 04/25/2017 1550   AST 15 05/01/2021 1411   AST 23 04/25/2017 1550   ALT 20 05/01/2021 1411   ALT 28 04/25/2017 1550   BILITOT 0.3 05/01/2021 1411   BILITOT 0.48 04/25/2017 1550       Impression and Plan: Heather Lloyd is a very pleasant 30 yo caucasian female with history of left lower extremity DVT and bilateral pulmonary emboli, factor V Leiden mutation-heterozygous, antithrombin III deficiency and 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   She is now pregnant with her first child, due on 12/29/2021.  She is tolerating Lovenox well and will continue her same regimen 120 mg SQ BID. No coag studies needed per MD.  Follow-up in another 8 weeks.  They can contact our office with any questions or concerns. We can certainly see her sooner if needed.   Laverna Peace, NP 7/28/202210:30 AM

## 2021-06-08 NOTE — Telephone Encounter (Signed)
Appts made per 7.28.22 los and pt placed in phone/declined calendar  Rickell Wiehe

## 2021-06-10 LAB — VON WILLEBRAND PANEL
Coagulation Factor VIII: 241 % — ABNORMAL HIGH (ref 56–140)
Ristocetin Co-factor, Plasma: 234 % — ABNORMAL HIGH (ref 50–200)
Von Willebrand Antigen, Plasma: 291 % — ABNORMAL HIGH (ref 50–200)

## 2021-06-10 LAB — COAG STUDIES INTERP REPORT

## 2021-06-28 ENCOUNTER — Ambulatory Visit: Payer: 59

## 2021-06-29 ENCOUNTER — Other Ambulatory Visit: Payer: Self-pay | Admitting: Obstetrics and Gynecology

## 2021-06-29 DIAGNOSIS — O99211 Obesity complicating pregnancy, first trimester: Secondary | ICD-10-CM

## 2021-06-29 DIAGNOSIS — D6859 Other primary thrombophilia: Secondary | ICD-10-CM

## 2021-06-29 DIAGNOSIS — Z3A13 13 weeks gestation of pregnancy: Secondary | ICD-10-CM

## 2021-06-29 DIAGNOSIS — Z8759 Personal history of other complications of pregnancy, childbirth and the puerperium: Secondary | ICD-10-CM

## 2021-06-29 DIAGNOSIS — D6851 Activated protein C resistance: Secondary | ICD-10-CM

## 2021-06-29 DIAGNOSIS — Z86711 Personal history of pulmonary embolism: Secondary | ICD-10-CM

## 2021-06-29 DIAGNOSIS — Z90721 Acquired absence of ovaries, unilateral: Secondary | ICD-10-CM

## 2021-06-29 DIAGNOSIS — Z7901 Long term (current) use of anticoagulants: Secondary | ICD-10-CM

## 2021-06-30 ENCOUNTER — Ambulatory Visit: Payer: 59 | Admitting: *Deleted

## 2021-06-30 ENCOUNTER — Ambulatory Visit (HOSPITAL_BASED_OUTPATIENT_CLINIC_OR_DEPARTMENT_OTHER): Payer: 59 | Admitting: Obstetrics

## 2021-06-30 ENCOUNTER — Ambulatory Visit: Payer: 59 | Attending: Pediatrics

## 2021-06-30 ENCOUNTER — Encounter: Payer: Self-pay | Admitting: *Deleted

## 2021-06-30 ENCOUNTER — Other Ambulatory Visit: Payer: Self-pay

## 2021-06-30 VITALS — BP 116/61 | HR 87

## 2021-06-30 DIAGNOSIS — O99211 Obesity complicating pregnancy, first trimester: Secondary | ICD-10-CM | POA: Diagnosis present

## 2021-06-30 DIAGNOSIS — I871 Compression of vein: Secondary | ICD-10-CM | POA: Diagnosis not present

## 2021-06-30 DIAGNOSIS — D6859 Other primary thrombophilia: Secondary | ICD-10-CM

## 2021-06-30 DIAGNOSIS — Z86711 Personal history of pulmonary embolism: Secondary | ICD-10-CM | POA: Diagnosis not present

## 2021-06-30 DIAGNOSIS — Z3A14 14 weeks gestation of pregnancy: Secondary | ICD-10-CM | POA: Insufficient documentation

## 2021-06-30 DIAGNOSIS — O99212 Obesity complicating pregnancy, second trimester: Secondary | ICD-10-CM | POA: Insufficient documentation

## 2021-06-30 DIAGNOSIS — Z7901 Long term (current) use of anticoagulants: Secondary | ICD-10-CM | POA: Diagnosis not present

## 2021-06-30 DIAGNOSIS — E669 Obesity, unspecified: Secondary | ICD-10-CM

## 2021-06-30 DIAGNOSIS — O99119 Other diseases of the blood and blood-forming organs and certain disorders involving the immune mechanism complicating pregnancy, unspecified trimester: Secondary | ICD-10-CM

## 2021-06-30 DIAGNOSIS — D6851 Activated protein C resistance: Secondary | ICD-10-CM | POA: Insufficient documentation

## 2021-06-30 DIAGNOSIS — Z86718 Personal history of other venous thrombosis and embolism: Secondary | ICD-10-CM

## 2021-06-30 DIAGNOSIS — Z8759 Personal history of other complications of pregnancy, childbirth and the puerperium: Secondary | ICD-10-CM | POA: Insufficient documentation

## 2021-06-30 DIAGNOSIS — O99112 Other diseases of the blood and blood-forming organs and certain disorders involving the immune mechanism complicating pregnancy, second trimester: Secondary | ICD-10-CM | POA: Insufficient documentation

## 2021-06-30 DIAGNOSIS — Z90721 Acquired absence of ovaries, unilateral: Secondary | ICD-10-CM

## 2021-06-30 DIAGNOSIS — O2292 Venous complication in pregnancy, unspecified, second trimester: Secondary | ICD-10-CM | POA: Diagnosis not present

## 2021-06-30 DIAGNOSIS — O99412 Diseases of the circulatory system complicating pregnancy, second trimester: Secondary | ICD-10-CM | POA: Diagnosis not present

## 2021-06-30 DIAGNOSIS — O3482 Maternal care for other abnormalities of pelvic organs, second trimester: Secondary | ICD-10-CM

## 2021-06-30 DIAGNOSIS — N83292 Other ovarian cyst, left side: Secondary | ICD-10-CM

## 2021-06-30 DIAGNOSIS — Z3A13 13 weeks gestation of pregnancy: Secondary | ICD-10-CM

## 2021-07-01 NOTE — Progress Notes (Signed)
MFM Note  Heather Lloyd is a 30 year old gravida 1 para 0 currently at 14 weeks and 0 days.  She was seen in consultation today due to a significant history of thrombosis.  The patient reports that in 2009 she experienced a left-sided DVT which then progressed to bilateral pulmonary emboli.  She subsequently had 5 vascular surgeries to remove the clot which were unsuccessful.  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    She has been followed by hematology who has recommended that she remain on lifelong anticoagulation.  She was treated with Eliquis prior to pregnancy.  She was switched to therapeutic Lovenox 120 mg twice a day once she found out she was pregnant.  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  She was seen vascular surgery who has stated that there is no contraindication for pregnancy.  They also note that she has large abdominal wall collateral blood vessels which may become significant should she require a cesarean delivery.  Her past medical history includes diverticulitis and pre-diabetes.  Her hemoglobin A1c drawn earlier in her current pregnancy was 5.7%.    Her past surgical history includes the multiple surgeries to remove the blood clot in her left leg and a right ovarian oophorectomy to remove a dermoid cyst in 2019.  An ultrasound performed today shows a viable singleton intrauterine gestation with a crown-rump length that is consistent with an EDC of December 29, 2021, making her 14 weeks and 0 day pregnant today.  During our consultation today the following were discussed:  Thrombophilia with prior thromboembolic event and pregnancy  The patient was advised that heterozygosity for the factor V Leiden gene mutation is quite common (10% of the Caucasian population will screen positive  for this mutation).  Although rare, Antithrombin III deficiency is the most thrombogenic of the thrombophilias.    People who have two or more thrombophilias may be at an increased risk for thrombosis. Pregnancy may further increase that risk.   As she has two thrombophilias including the Antithrombin III deficiency with a prior thromboembolic event, I would recommend that she continue therapeutic Lovenox for the duration of her pregnancy and in the postpartum period.  As she is treated with a therapeutic dose of Lovenox, she should have monthly assessments of the anti-factor Xa level 4 hours after she gives herself the injection to maintain anti-factor Xa levels between 0.6 units/mL and 1.0 units/mL.  Her Lovenox dosage may have to be adjusted based on the anti-factor Xa levels.  The association of adverse pregnancy outcomes such as fetal growth restriction, stillbirth, preeclampsia, and placental abruption with thrombophilias was discussed.  The overall risk of an adverse pregnancy outcome is probably low, as studies linking the association between thrombophilias and adverse pregnancy outcomes are conflicting.  Due to the association of an adverse pregnancy outcome with thrombophilia, she should continue to be followed with monthly growth ultrasounds.  Weekly fetal testing should be started at around 32 weeks.  Her delivery should be scheduled at around 39 weeks (if she does not develop preeclampsia or another complication).   In regards to the management of her anticoagulation and delivery, she should continue therapeutic Lovenox up until the day before her scheduled delivery date.  As she is treated with a therapeutic dose of anticoagulation, she will be eligible to  receive regional anesthesia if it has been 24 hours or more since she received the anticoagulation medication.    She should have an anesthesia consult prior to delivery.  She should be resumed on therapeutic anticoagulation 6  hours following a vaginal delivery and 12 hours following a cesarean delivery.  Either Lovenox or warfarin (Coumadin) may be used in the postpartum period while she is breastfeeding.  The patient was reassured that most women with the factor V Leiden gene mutation and the Antithrombin III deficiency have had  successful pregnancy outcomes with proper management.  As the patient has not had a screening test for fetal aneuploidy drawn yet, she was advised regarding the availability of a cell free DNA test through the lab [facility] for $99.  The patient and her husband will think about having this test drawn and will contact our office next week when our genetic counselor is available to help her order this test.    A detailed fetal anatomy scan has been scheduled in our office at around 19 weeks.    We will continue to follow her closely with you throughout her pregnancy.  The patient and her husband stated that all of their questions have been answered to their complete satisfaction.  A total of 60 minutes was spent counseling and coordinating the care for this patient.  Greater than 50% of the time was spent in direct face-to-face contact.  Recommendations:  Continue therapeutic Lovenox for the duration of her pregnancy and in the postpartum period Monthly monitoring of anti-factor Xa levels to ensure that her anticoagulation is in the therapeutic range Detailed fetal anatomy scan at 19 weeks Monthly growth ultrasounds Weekly fetal testing starting at 32 weeks Schedule delivery at around 39 weeks Lovenox should be held for 24 hours prior to her scheduled delivery Anesthesia consult prior to delivery Resume therapeutic anticoagulation 6 hours following a vaginal delivery or 12 hours following a vaginal delivery

## 2021-07-05 ENCOUNTER — Other Ambulatory Visit: Payer: Self-pay

## 2021-07-05 ENCOUNTER — Ambulatory Visit: Payer: 59 | Attending: Obstetrics

## 2021-07-05 DIAGNOSIS — D6859 Other primary thrombophilia: Secondary | ICD-10-CM | POA: Diagnosis not present

## 2021-07-05 DIAGNOSIS — O2292 Venous complication in pregnancy, unspecified, second trimester: Secondary | ICD-10-CM | POA: Diagnosis not present

## 2021-07-05 DIAGNOSIS — O99112 Other diseases of the blood and blood-forming organs and certain disorders involving the immune mechanism complicating pregnancy, second trimester: Secondary | ICD-10-CM | POA: Diagnosis present

## 2021-07-05 DIAGNOSIS — Z3A14 14 weeks gestation of pregnancy: Secondary | ICD-10-CM | POA: Insufficient documentation

## 2021-07-12 ENCOUNTER — Ambulatory Visit: Payer: 59

## 2021-07-13 ENCOUNTER — Telehealth: Payer: Self-pay | Admitting: Obstetrics and Gynecology

## 2021-07-13 NOTE — Telephone Encounter (Signed)
The patient was informed of the results of her recent [facility] Non-Invasive Prenatal Screening (NIPS) which yielded NEGATIVE results.  The patient's specimen showed DNA consistent with two copies of chromosomes 21, 18 and 13.  The sensitivity for trisomy 19, trisomy 33 and trisomy 80 using this testing are reported as 99.9% .  While the results of this testing are highly accurate, they are not considered diagnostic.  Should more definitive information be desired, the patient may still consider amniocentesis.   As requested to know by the patient, sex chromosome analysis was included for this sample.  Results are consistent with a female fetus. This is predicted with >99% accuracy. This testing also screens for sex chromosome conditions with greater than 99% accuracy and was negative for those conditions.   A maternal serum AFP only should be considered if screening for neural tube defects is desired.  We may be reached at [phone] with any questions or concerns.  Wilburt Finlay, MS, CGC

## 2021-07-27 ENCOUNTER — Encounter: Payer: Self-pay | Admitting: *Deleted

## 2021-07-27 ENCOUNTER — Ambulatory Visit (HOSPITAL_BASED_OUTPATIENT_CLINIC_OR_DEPARTMENT_OTHER): Payer: 59

## 2021-07-27 ENCOUNTER — Ambulatory Visit: Payer: 59 | Attending: Obstetrics and Gynecology | Admitting: *Deleted

## 2021-07-27 ENCOUNTER — Other Ambulatory Visit: Payer: Self-pay | Admitting: *Deleted

## 2021-07-27 ENCOUNTER — Other Ambulatory Visit: Payer: Self-pay

## 2021-07-27 ENCOUNTER — Other Ambulatory Visit: Payer: Self-pay | Admitting: Obstetrics and Gynecology

## 2021-07-27 VITALS — BP 123/75 | HR 85

## 2021-07-27 DIAGNOSIS — O2692 Pregnancy related conditions, unspecified, second trimester: Secondary | ICD-10-CM | POA: Insufficient documentation

## 2021-07-27 DIAGNOSIS — O99891 Other specified diseases and conditions complicating pregnancy: Secondary | ICD-10-CM | POA: Diagnosis not present

## 2021-07-27 DIAGNOSIS — O99119 Other diseases of the blood and blood-forming organs and certain disorders involving the immune mechanism complicating pregnancy, unspecified trimester: Secondary | ICD-10-CM | POA: Diagnosis not present

## 2021-07-27 DIAGNOSIS — D6851 Activated protein C resistance: Secondary | ICD-10-CM | POA: Diagnosis not present

## 2021-07-27 DIAGNOSIS — Z86718 Personal history of other venous thrombosis and embolism: Secondary | ICD-10-CM | POA: Diagnosis not present

## 2021-07-27 DIAGNOSIS — O99892 Other specified diseases and conditions complicating childbirth: Secondary | ICD-10-CM | POA: Insufficient documentation

## 2021-07-27 DIAGNOSIS — Z3A17 17 weeks gestation of pregnancy: Secondary | ICD-10-CM

## 2021-07-27 DIAGNOSIS — Q969 Turner's syndrome, unspecified: Secondary | ICD-10-CM | POA: Diagnosis not present

## 2021-07-27 DIAGNOSIS — Z363 Encounter for antenatal screening for malformations: Secondary | ICD-10-CM

## 2021-07-27 DIAGNOSIS — O99112 Other diseases of the blood and blood-forming organs and certain disorders involving the immune mechanism complicating pregnancy, second trimester: Secondary | ICD-10-CM | POA: Insufficient documentation

## 2021-07-27 DIAGNOSIS — O3482 Maternal care for other abnormalities of pelvic organs, second trimester: Secondary | ICD-10-CM | POA: Diagnosis not present

## 2021-07-27 DIAGNOSIS — O09292 Supervision of pregnancy with other poor reproductive or obstetric history, second trimester: Secondary | ICD-10-CM | POA: Insufficient documentation

## 2021-07-28 ENCOUNTER — Ambulatory Visit: Payer: 59

## 2021-07-31 ENCOUNTER — Inpatient Hospital Stay: Payer: 59 | Admitting: Hematology & Oncology

## 2021-07-31 ENCOUNTER — Inpatient Hospital Stay: Payer: 59

## 2021-07-31 ENCOUNTER — Encounter: Payer: Self-pay | Admitting: Hematology & Oncology

## 2021-07-31 ENCOUNTER — Other Ambulatory Visit: Payer: Self-pay | Admitting: *Deleted

## 2021-07-31 ENCOUNTER — Other Ambulatory Visit: Payer: Self-pay

## 2021-07-31 ENCOUNTER — Inpatient Hospital Stay: Payer: 59 | Attending: Hematology & Oncology

## 2021-07-31 ENCOUNTER — Telehealth: Payer: Self-pay

## 2021-07-31 VITALS — BP 120/67 | HR 80 | Temp 98.7°F | Resp 20 | Wt 264.1 lb

## 2021-07-31 DIAGNOSIS — Z7901 Long term (current) use of anticoagulants: Secondary | ICD-10-CM | POA: Insufficient documentation

## 2021-07-31 DIAGNOSIS — D6851 Activated protein C resistance: Secondary | ICD-10-CM | POA: Insufficient documentation

## 2021-07-31 DIAGNOSIS — Z86718 Personal history of other venous thrombosis and embolism: Secondary | ICD-10-CM | POA: Insufficient documentation

## 2021-07-31 DIAGNOSIS — I871 Compression of vein: Secondary | ICD-10-CM | POA: Insufficient documentation

## 2021-07-31 DIAGNOSIS — Z86711 Personal history of pulmonary embolism: Secondary | ICD-10-CM | POA: Insufficient documentation

## 2021-07-31 DIAGNOSIS — I82512 Chronic embolism and thrombosis of left femoral vein: Secondary | ICD-10-CM

## 2021-07-31 DIAGNOSIS — O99119 Other diseases of the blood and blood-forming organs and certain disorders involving the immune mechanism complicating pregnancy, unspecified trimester: Secondary | ICD-10-CM | POA: Diagnosis present

## 2021-07-31 LAB — CBC WITH DIFFERENTIAL (CANCER CENTER ONLY)
Abs Immature Granulocytes: 0.22 10*3/uL — ABNORMAL HIGH (ref 0.00–0.07)
Basophils Absolute: 0 10*3/uL (ref 0.0–0.1)
Basophils Relative: 0 %
Eosinophils Absolute: 0.1 10*3/uL (ref 0.0–0.5)
Eosinophils Relative: 1 %
HCT: 33.5 % — ABNORMAL LOW (ref 36.0–46.0)
Hemoglobin: 11.3 g/dL — ABNORMAL LOW (ref 12.0–15.0)
Immature Granulocytes: 2 %
Lymphocytes Relative: 22 %
Lymphs Abs: 2.1 10*3/uL (ref 0.7–4.0)
MCH: 29.1 pg (ref 26.0–34.0)
MCHC: 33.7 g/dL (ref 30.0–36.0)
MCV: 86.3 fL (ref 80.0–100.0)
Monocytes Absolute: 0.5 10*3/uL (ref 0.1–1.0)
Monocytes Relative: 5 %
Neutro Abs: 6.5 10*3/uL (ref 1.7–7.7)
Neutrophils Relative %: 70 %
Platelet Count: 192 10*3/uL (ref 150–400)
RBC: 3.88 MIL/uL (ref 3.87–5.11)
RDW: 14.2 % (ref 11.5–15.5)
WBC Count: 9.4 10*3/uL (ref 4.0–10.5)
nRBC: 0 % (ref 0.0–0.2)

## 2021-07-31 LAB — CMP (CANCER CENTER ONLY)
ALT: 25 U/L (ref 0–44)
AST: 14 U/L — ABNORMAL LOW (ref 15–41)
Albumin: 3.9 g/dL (ref 3.5–5.0)
Alkaline Phosphatase: 41 U/L (ref 38–126)
Anion gap: 9 (ref 5–15)
BUN: 8 mg/dL (ref 6–20)
CO2: 22 mmol/L (ref 22–32)
Calcium: 9.2 mg/dL (ref 8.9–10.3)
Chloride: 106 mmol/L (ref 98–111)
Creatinine: 0.71 mg/dL (ref 0.44–1.00)
GFR, Estimated: >60 mL/min (ref >60)
Glucose, Bld: 89 mg/dL (ref 70–99)
Potassium: 3.9 mmol/L (ref 3.5–5.1)
Sodium: 137 mmol/L (ref 135–145)
Total Bilirubin: 0.3 mg/dL (ref 0.3–1.2)
Total Protein: 6.9 g/dL (ref 6.5–8.1)

## 2021-07-31 LAB — HEPARIN ANTI-XA: Heparin LMW: 0.47 IU/mL

## 2021-07-31 MED ORDER — ENOXAPARIN SODIUM 120 MG/0.8ML IJ SOSY: 120.0000 mg | PREFILLED_SYRINGE | Freq: Two times a day (BID) | INTRAMUSCULAR | 0 refills | Status: DC

## 2021-07-31 MED ORDER — ENOXAPARIN SODIUM 120 MG/0.8ML IJ SOSY: 120.0000 mg | PREFILLED_SYRINGE | Freq: Two times a day (BID) | INTRAMUSCULAR | 3 refills | Status: DC

## 2021-07-31 NOTE — Telephone Encounter (Signed)
Appts made per 07/31/21 los, pt placed in phone

## 2021-07-31 NOTE — Progress Notes (Signed)
Hematology and Oncology Follow Up Visit  Heather Lloyd [id number] Jul 11, 1991 30 y.o. 07/31/2021   Principle Diagnosis:  History of left lower extremity DVT and bilateral pulmonary emboli Factor V Leiden mutation-heterozygous Antithrombin III deficiency May-Thurner syndrome   Current Therapy:        Lovenox 120 mg SQ BID - pregnant, due date 12/29/2021   Interim History:  Heather Lloyd is here today for follow-up.  She comes in with her husband/boyfriend.  She seems be doing pretty well so far.  So far, her baby boy is developing.  She is doing well on the Lovenox.  She is on a very aggressive protocol of with Lovenox.  She has had no problems with bleeding.  She actually has very little bruising on the abdominal wall..  There is been no problems with leg swelling.  She has had no cough or shortness of breath.  She has had no fever.  Thankfully, has been no issues with COVID.  She is taking her prenatal vitamins.  Overall, I would have to say that she has had no trouble with bowels or bladder.  She does urinate quite frequently.  At this no dysuria.  She has had no headache.  I think she is still working.  Currently, I would say her performance status is ECOG 1.    Medications:  Allergies as of 07/31/2021       Reactions   Codeine Hives, Swelling   Ibuprofen Other (See Comments)   Pt is on blood thinners        Medication List        Accurate as of July 31, 2021  4:55 PM. If you have any questions, ask your nurse or doctor.          cyclobenzaprine 5 MG tablet Commonly known as: FLEXERIL Take 10 tablets by mouth as needed for cramping. What changed: Another medication with the same name was removed. Continue taking this medication, and follow the directions you see here. Changed by: Volanda Napoleon, MD   Eliquis 5 MG Tabs tablet Generic drug: apixaban Take 1 tablet by mouth twice daily   enoxaparin 120 MG/0.8ML injection Commonly known as:  LOVENOX Inject 0.8 mLs (120 mg total) into the skin every 12 (twelve) hours.   melatonin 5 MG Tabs Take 5 mg by mouth at bedtime as needed (sleep).   multivitamin with minerals Tabs tablet Take 2 tablets by mouth daily.   norethindrone 0.35 MG tablet Commonly known as: MICRONOR Take 1 tablet by mouth at bedtime.   polyethylene glycol 17 g packet Commonly known as: MIRALAX / GLYCOLAX Take 17 g by mouth daily as needed.   prenatal multivitamin Tabs tablet Take 1 tablet by mouth daily at 12 noon.        Allergies:  Allergies  Allergen Reactions   Codeine Hives and Swelling   Ibuprofen Other (See Comments)    Pt is on blood thinners     Past Medical History, Surgical history, Social history, and Family History were reviewed and updated.  Review of Systems: Review of Systems  Constitutional: Negative.   HENT: Negative.    Eyes: Negative.   Respiratory: Negative.    Cardiovascular: Negative.   Gastrointestinal: Negative.   Genitourinary: Negative.   Musculoskeletal: Negative.   Skin: Negative.   Neurological: Negative.   Endo/Heme/Allergies: Negative.   Psychiatric/Behavioral: Negative.      Physical Exam:  weight is 264 lb 1.3 oz (119.8 kg). Her oral temperature is 98.7 F (  37.1 C). Her blood pressure is 120/67 and her pulse is 80. Her respiration is 20 and oxygen saturation is 100%.   Wt Readings from Last 3 Encounters:  07/31/21 264 lb 1.3 oz (119.8 kg)  06/08/21 261 lb 6.4 oz (118.6 kg)  05/01/21 263 lb (119.3 kg)    Physical Exam Vitals reviewed.  HENT:     Head: Normocephalic and atraumatic.  Eyes:     Pupils: Pupils are equal, round, and reactive to light.  Cardiovascular:     Rate and Rhythm: Normal rate and regular rhythm.     Heart sounds: Normal heart sounds.  Pulmonary:     Effort: Pulmonary effort is normal.     Breath sounds: Normal breath sounds.  Abdominal:     General: Bowel sounds are normal.     Palpations: Abdomen is soft.   Musculoskeletal:        General: No tenderness or deformity. Normal range of motion.     Cervical back: Normal range of motion.  Lymphadenopathy:     Cervical: No cervical adenopathy.  Skin:    General: Skin is warm and dry.     Findings: No erythema or rash.  Neurological:     Mental Status: She is alert and oriented to person, place, and time.  Psychiatric:        Behavior: Behavior normal.        Thought Content: Thought content normal.        Judgment: Judgment normal.    Lab Results  Component Value Date   WBC 9.4 07/31/2021   HGB 11.3 (L) 07/31/2021   HCT 33.5 (L) 07/31/2021   MCV 86.3 07/31/2021   PLT 192 07/31/2021   No results found for: FERRITIN, IRON, TIBC, UIBC, IRONPCTSAT Lab Results  Component Value Date   RETICCTPCT 2.24 (H) 04/25/2017   RBC 3.88 07/31/2021   RETICCTABS 105.73 (H) 04/25/2017   No results found for: KPAFRELGTCHN, LAMBDASER, KAPLAMBRATIO No results found for: IGGSERUM, IGA, IGMSERUM No results found for: Odetta Pink, SPEI   Chemistry      Component Value Date/Time   NA 137 07/31/2021 1011   NA 140 04/25/2017 1550   K 3.9 07/31/2021 1011   K 3.8 04/25/2017 1550   CL 106 07/31/2021 1011   CO2 22 07/31/2021 1011   CO2 26 04/25/2017 1550   BUN 8 07/31/2021 1011   BUN 12.5 04/25/2017 1550   CREATININE 0.71 07/31/2021 1011   CREATININE 1.0 04/25/2017 1550      Component Value Date/Time   CALCIUM 9.2 07/31/2021 1011   CALCIUM 9.6 04/25/2017 1550   ALKPHOS 41 07/31/2021 1011   ALKPHOS 60 04/25/2017 1550   AST 14 (L) 07/31/2021 1011   AST 23 04/25/2017 1550   ALT 25 07/31/2021 1011   ALT 28 04/25/2017 1550   BILITOT 0.3 07/31/2021 1011   BILITOT 0.48 04/25/2017 1550       Impression and Plan: 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    So far, her pregnancy is going well.  The Lovenox is definitely helping [place] out.  I am very happy for her.  I really think she is doing a tremendous job with the Lovenox.  I know this is not easy given herself the injections.  We we will get  her back to see [place] in November.  Think she sees the obstetrician and the high risk obstetrician fairly often.  I am glad her blood counts look pretty good.  We will certainly try to be aggressive to get her through this pregnancy.  She is due in February.   Volanda Napoleon, MD 9/19/20224:55 PM

## 2021-08-01 ENCOUNTER — Other Ambulatory Visit: Payer: Self-pay | Admitting: *Deleted

## 2021-08-01 MED ORDER — ENOXAPARIN SODIUM 120 MG/0.8ML IJ SOSY
120.0000 mg | PREFILLED_SYRINGE | Freq: Two times a day (BID) | INTRAMUSCULAR | 0 refills | Status: DC
Start: 2021-08-01 — End: 2021-08-11

## 2021-08-04 ENCOUNTER — Encounter: Payer: Self-pay | Admitting: *Deleted

## 2021-08-04 DIAGNOSIS — I871 Compression of vein: Secondary | ICD-10-CM | POA: Insufficient documentation

## 2021-08-04 DIAGNOSIS — D6851 Activated protein C resistance: Secondary | ICD-10-CM | POA: Insufficient documentation

## 2021-08-04 DIAGNOSIS — Z86718 Personal history of other venous thrombosis and embolism: Secondary | ICD-10-CM | POA: Insufficient documentation

## 2021-08-04 DIAGNOSIS — D6859 Other primary thrombophilia: Secondary | ICD-10-CM | POA: Insufficient documentation

## 2021-08-04 DIAGNOSIS — Z86711 Personal history of pulmonary embolism: Secondary | ICD-10-CM | POA: Insufficient documentation

## 2021-08-08 ENCOUNTER — Other Ambulatory Visit: Payer: Self-pay

## 2021-08-11 ENCOUNTER — Other Ambulatory Visit: Payer: Self-pay | Admitting: *Deleted

## 2021-08-11 ENCOUNTER — Telehealth: Payer: Self-pay | Admitting: *Deleted

## 2021-08-11 MED ORDER — ENOXAPARIN SODIUM 120 MG/0.8ML IJ SOSY: 120.0000 mg | PREFILLED_SYRINGE | Freq: Two times a day (BID) | INTRAMUSCULAR | 0 refills | Status: DC

## 2021-08-11 NOTE — Telephone Encounter (Signed)
Call received from patient requesting for Lovenox refill to be sent to [facility] for a 90 day supply.  Refill sent per pt.'s request.

## 2021-09-07 ENCOUNTER — Ambulatory Visit: Payer: 59 | Admitting: *Deleted

## 2021-09-07 ENCOUNTER — Ambulatory Visit: Payer: 59 | Attending: Maternal & Fetal Medicine

## 2021-09-07 ENCOUNTER — Other Ambulatory Visit: Payer: Self-pay

## 2021-09-07 ENCOUNTER — Encounter: Payer: Self-pay | Admitting: *Deleted

## 2021-09-07 ENCOUNTER — Other Ambulatory Visit: Payer: Self-pay | Admitting: *Deleted

## 2021-09-07 VITALS — BP 122/68 | HR 93

## 2021-09-07 DIAGNOSIS — D6851 Activated protein C resistance: Secondary | ICD-10-CM | POA: Insufficient documentation

## 2021-09-07 DIAGNOSIS — O09292 Supervision of pregnancy with other poor reproductive or obstetric history, second trimester: Secondary | ICD-10-CM

## 2021-09-07 DIAGNOSIS — I871 Compression of vein: Secondary | ICD-10-CM | POA: Insufficient documentation

## 2021-09-07 DIAGNOSIS — O99119 Other diseases of the blood and blood-forming organs and certain disorders involving the immune mechanism complicating pregnancy, unspecified trimester: Secondary | ICD-10-CM | POA: Insufficient documentation

## 2021-09-07 DIAGNOSIS — O99212 Obesity complicating pregnancy, second trimester: Secondary | ICD-10-CM | POA: Diagnosis not present

## 2021-09-07 DIAGNOSIS — Z6837 Body mass index (BMI) 37.0-37.9, adult: Secondary | ICD-10-CM

## 2021-09-07 DIAGNOSIS — O99112 Other diseases of the blood and blood-forming organs and certain disorders involving the immune mechanism complicating pregnancy, second trimester: Secondary | ICD-10-CM

## 2021-09-07 DIAGNOSIS — E669 Obesity, unspecified: Secondary | ICD-10-CM

## 2021-09-07 DIAGNOSIS — Z3A23 23 weeks gestation of pregnancy: Secondary | ICD-10-CM

## 2021-09-07 DIAGNOSIS — Z86711 Personal history of pulmonary embolism: Secondary | ICD-10-CM

## 2021-10-02 ENCOUNTER — Inpatient Hospital Stay: Payer: 59 | Attending: Hematology & Oncology

## 2021-10-02 ENCOUNTER — Inpatient Hospital Stay: Payer: 59 | Admitting: Hematology & Oncology

## 2021-10-02 ENCOUNTER — Encounter: Payer: Self-pay | Admitting: Hematology & Oncology

## 2021-10-02 ENCOUNTER — Other Ambulatory Visit: Payer: Self-pay

## 2021-10-02 VITALS — BP 106/60 | HR 93 | Temp 98.4°F | Resp 16 | Wt 276.0 lb

## 2021-10-02 DIAGNOSIS — I82512 Chronic embolism and thrombosis of left femoral vein: Secondary | ICD-10-CM

## 2021-10-02 DIAGNOSIS — M7989 Other specified soft tissue disorders: Secondary | ICD-10-CM | POA: Insufficient documentation

## 2021-10-02 DIAGNOSIS — Z86711 Personal history of pulmonary embolism: Secondary | ICD-10-CM | POA: Diagnosis not present

## 2021-10-02 DIAGNOSIS — D6851 Activated protein C resistance: Secondary | ICD-10-CM | POA: Insufficient documentation

## 2021-10-02 DIAGNOSIS — Z86718 Personal history of other venous thrombosis and embolism: Secondary | ICD-10-CM | POA: Diagnosis not present

## 2021-10-02 DIAGNOSIS — O99119 Other diseases of the blood and blood-forming organs and certain disorders involving the immune mechanism complicating pregnancy, unspecified trimester: Secondary | ICD-10-CM | POA: Insufficient documentation

## 2021-10-02 LAB — CBC WITH DIFFERENTIAL (CANCER CENTER ONLY)
Abs Immature Granulocytes: 0.4 10*3/uL — ABNORMAL HIGH (ref 0.00–0.07)
Basophils Absolute: 0 10*3/uL (ref 0.0–0.1)
Basophils Relative: 0 %
Eosinophils Absolute: 0.1 10*3/uL (ref 0.0–0.5)
Eosinophils Relative: 1 %
HCT: 34.1 % — ABNORMAL LOW (ref 36.0–46.0)
Hemoglobin: 11.2 g/dL — ABNORMAL LOW (ref 12.0–15.0)
Immature Granulocytes: 3 %
Lymphocytes Relative: 19 %
Lymphs Abs: 2.4 10*3/uL (ref 0.7–4.0)
MCH: 28.1 pg (ref 26.0–34.0)
MCHC: 32.8 g/dL (ref 30.0–36.0)
MCV: 85.7 fL (ref 80.0–100.0)
Monocytes Absolute: 0.7 10*3/uL (ref 0.1–1.0)
Monocytes Relative: 6 %
Neutro Abs: 8.8 10*3/uL — ABNORMAL HIGH (ref 1.7–7.7)
Neutrophils Relative %: 71 %
Platelet Count: 189 10*3/uL (ref 150–400)
RBC: 3.98 MIL/uL (ref 3.87–5.11)
RDW: 14.6 % (ref 11.5–15.5)
WBC Count: 12.5 10*3/uL — ABNORMAL HIGH (ref 4.0–10.5)
nRBC: 0 % (ref 0.0–0.2)

## 2021-10-02 LAB — CMP (CANCER CENTER ONLY)
ALT: 9 U/L (ref 0–44)
AST: 8 U/L — ABNORMAL LOW (ref 15–41)
Albumin: 3.9 g/dL (ref 3.5–5.0)
Alkaline Phosphatase: 52 U/L (ref 38–126)
Anion gap: 9 (ref 5–15)
BUN: 11 mg/dL (ref 6–20)
CO2: 21 mmol/L — ABNORMAL LOW (ref 22–32)
Calcium: 9.4 mg/dL (ref 8.9–10.3)
Chloride: 105 mmol/L (ref 98–111)
Creatinine: 0.9 mg/dL (ref 0.44–1.00)
GFR, Estimated: >60 mL/min (ref >60)
Glucose, Bld: 95 mg/dL (ref 70–99)
Potassium: 4 mmol/L (ref 3.5–5.1)
Sodium: 135 mmol/L (ref 135–145)
Total Bilirubin: 0.2 mg/dL — ABNORMAL LOW (ref 0.3–1.2)
Total Protein: 6.8 g/dL (ref 6.5–8.1)

## 2021-10-02 LAB — LACTATE DEHYDROGENASE: LDH: 114 U/L (ref 98–192)

## 2021-10-02 LAB — HEPARIN ANTI-XA: Heparin LMW: 0.51 IU/mL

## 2021-10-02 NOTE — Progress Notes (Signed)
Hematology and Oncology Follow Up Visit  Heather Lloyd [id number] 28-Nov-1990 30 y.o. 10/02/2021   Principle Diagnosis:  History of left lower extremity DVT and bilateral pulmonary emboli Factor V Leiden mutation-heterozygous Antithrombin III deficiency May-Thurner syndrome   Current Therapy:        Lovenox 120 mg SQ BID - pregnant, due date 12/29/2021   Interim History:  Heather Lloyd is here today for follow-up.  So far, there are things going pretty well with her baby boy.  He is developing well.  She sees the obstetrician I think tomorrow.  I think she gets monthly ultrasounds.  She is doing well on the Lovenox.  She has had no bleeding.  She has had no problems with bowels or bladder.  She does urinate quite a bit.  She has had no fever.  She has had no cough or shortness of breath.  She  has a little bit of discomfort in the legs.  There is chronic swelling in the left leg.  I reviewed now that she does wear the compression stocking.  She has had no headache.  Her appetite is doing pretty well.  She did have a glucose tolerance test today.  Her blood sugar in the office was only 95.  I would think that she would not have any type of gestational diabetes.  Overall, I would say her performance status is ECOG 0.    Medications:  Allergies as of 10/02/2021       Reactions   Codeine Hives, Swelling   Ibuprofen Other (See Comments)   Pt is on blood thinners        Medication List        Accurate as of October 02, 2021  4:50 PM. If you have any questions, ask your nurse or doctor.          STOP taking these medications    cyclobenzaprine 5 MG tablet Commonly known as: FLEXERIL Stopped by: Heather Napoleon, MD   Eliquis 5 MG Tabs tablet Generic drug: apixaban Stopped by: Heather Napoleon, MD   melatonin 5 MG Tabs Stopped by: Heather Napoleon, MD   multivitamin with minerals Tabs tablet Stopped by: Heather Napoleon, MD   norethindrone 0.35 MG  tablet Commonly known as: MICRONOR Stopped by: Heather Napoleon, MD       TAKE these medications    doxylamine (Sleep) 25 MG tablet Commonly known as: UNISOM Take 25 mg by mouth at bedtime as needed.   enoxaparin 120 MG/0.8ML injection Commonly known as: LOVENOX Inject 0.8 mLs (120 mg total) into the skin every 12 (twelve) hours.   polyethylene glycol 17 g packet Commonly known as: MIRALAX / GLYCOLAX Take 17 g by mouth daily as needed.   prenatal multivitamin Tabs tablet Take 1 tablet by mouth daily at 12 noon.        Allergies:  Allergies  Allergen Reactions   Codeine Hives and Swelling   Ibuprofen Other (See Comments)    Pt is on blood thinners     Past Medical History, Surgical history, Social history, and Family History were reviewed and updated.  Review of Systems: Review of Systems  Constitutional: Negative.   HENT: Negative.    Eyes: Negative.   Respiratory: Negative.    Cardiovascular: Negative.   Gastrointestinal: Negative.   Genitourinary: Negative.   Musculoskeletal: Negative.   Skin: Negative.   Neurological: Negative.   Endo/Heme/Allergies: Negative.   Psychiatric/Behavioral: Negative.      Physical  Exam:  weight is 276 lb (125.2 kg). Her oral temperature is 98.4 F (36.9 C). Her blood pressure is 106/60 and her pulse is 93. Her respiration is 16 and oxygen saturation is 100%.   Wt Readings from Last 3 Encounters:  10/02/21 276 lb (125.2 kg)  07/31/21 264 lb 1.3 oz (119.8 kg)  06/08/21 261 lb 6.4 oz (118.6 kg)    Physical Exam Vitals reviewed.  HENT:     Head: Normocephalic and atraumatic.  Eyes:     Pupils: Pupils are equal, round, and reactive to light.  Cardiovascular:     Rate and Rhythm: Normal rate and regular rhythm.     Heart sounds: Normal heart sounds.  Pulmonary:     Effort: Pulmonary effort is normal.     Breath sounds: Normal breath sounds.  Abdominal:     General: Bowel sounds are normal.     Palpations: Abdomen  is soft.  Musculoskeletal:        General: No tenderness or deformity. Normal range of motion.     Cervical back: Normal range of motion.  Lymphadenopathy:     Cervical: No cervical adenopathy.  Skin:    General: Skin is warm and dry.     Findings: No erythema or rash.  Neurological:     Mental Status: She is alert and oriented to person, place, and time.  Psychiatric:        Behavior: Behavior normal.        Thought Content: Thought content normal.        Judgment: Judgment normal.    Lab Results  Component Value Date   WBC 12.5 (H) 10/02/2021   HGB 11.2 (L) 10/02/2021   HCT 34.1 (L) 10/02/2021   MCV 85.7 10/02/2021   PLT 189 10/02/2021   No results found for: FERRITIN, IRON, TIBC, UIBC, IRONPCTSAT Lab Results  Component Value Date   RETICCTPCT 2.24 (H) 04/25/2017   RBC 3.98 10/02/2021   RETICCTABS 105.73 (H) 04/25/2017   No results found for: KPAFRELGTCHN, LAMBDASER, KAPLAMBRATIO No results found for: IGGSERUM, IGA, IGMSERUM No results found for: Heather Lloyd, SPEI   Chemistry      Component Value Date/Time   NA 135 10/02/2021 1535   NA 140 04/25/2017 1550   K 4.0 10/02/2021 1535   K 3.8 04/25/2017 1550   CL 105 10/02/2021 1535   CO2 21 (L) 10/02/2021 1535   CO2 26 04/25/2017 1550   BUN 11 10/02/2021 1535   BUN 12.5 04/25/2017 1550   CREATININE 0.90 10/02/2021 1535   CREATININE 1.0 04/25/2017 1550      Component Value Date/Time   CALCIUM 9.4 10/02/2021 1535   CALCIUM 9.6 04/25/2017 1550   ALKPHOS 52 10/02/2021 1535   ALKPHOS 60 04/25/2017 1550   AST 8 (L) 10/02/2021 1535   AST 23 04/25/2017 1550   ALT 9 10/02/2021 1535   ALT 28 04/25/2017 1550   BILITOT 0.2 (L) 10/02/2021 1535   BILITOT 0.48 04/25/2017 1550       Impression and Plan: Heather Lloyd is a very pleasant 30 yo caucasian female with history of left lower extremity DVT and bilateral pulmonary emboli, factor V Leiden mutation-heterozygous,  antithrombin III deficiency and 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    So far, her pregnancy is going well.  I think that she is still to be induced sometime in mid February.  I think she will find out probably in January.  At some  point, we are going to have to get her over to heparin.  I probably would not make the switch until after I see her.  I would like to see her in mid January.  By then, we will know what day she is going to be induced.  I am just glad that she is doing quite well.  I examined her legs and I cannot feel anything that looked suspicious for any type of thromboembolic disease.  I will see her back after the holidays.    Heather Napoleon, MD 11/21/20224:50 PM

## 2021-10-03 ENCOUNTER — Encounter: Payer: Self-pay | Admitting: *Deleted

## 2021-10-03 ENCOUNTER — Ambulatory Visit: Payer: 59 | Admitting: *Deleted

## 2021-10-03 ENCOUNTER — Ambulatory Visit: Payer: 59 | Attending: Obstetrics and Gynecology

## 2021-10-03 VITALS — BP 106/69 | HR 97

## 2021-10-03 DIAGNOSIS — O99210 Obesity complicating pregnancy, unspecified trimester: Secondary | ICD-10-CM

## 2021-10-03 DIAGNOSIS — I871 Compression of vein: Secondary | ICD-10-CM | POA: Diagnosis not present

## 2021-10-03 DIAGNOSIS — Z3A27 27 weeks gestation of pregnancy: Secondary | ICD-10-CM

## 2021-10-03 DIAGNOSIS — O99212 Obesity complicating pregnancy, second trimester: Secondary | ICD-10-CM

## 2021-10-03 DIAGNOSIS — O09292 Supervision of pregnancy with other poor reproductive or obstetric history, second trimester: Secondary | ICD-10-CM | POA: Diagnosis not present

## 2021-10-03 DIAGNOSIS — E669 Obesity, unspecified: Secondary | ICD-10-CM | POA: Diagnosis not present

## 2021-10-03 DIAGNOSIS — Z6837 Body mass index (BMI) 37.0-37.9, adult: Secondary | ICD-10-CM | POA: Diagnosis present

## 2021-10-03 DIAGNOSIS — Z86711 Personal history of pulmonary embolism: Secondary | ICD-10-CM | POA: Diagnosis present

## 2021-10-04 ENCOUNTER — Other Ambulatory Visit: Payer: Self-pay | Admitting: *Deleted

## 2021-10-04 DIAGNOSIS — D6851 Activated protein C resistance: Secondary | ICD-10-CM

## 2021-10-04 DIAGNOSIS — R638 Other symptoms and signs concerning food and fluid intake: Secondary | ICD-10-CM

## 2021-10-04 NOTE — Progress Notes (Unsigned)
us

## 2021-10-21 ENCOUNTER — Other Ambulatory Visit: Payer: Self-pay | Admitting: Hematology & Oncology

## 2021-10-31 ENCOUNTER — Other Ambulatory Visit: Payer: Self-pay | Admitting: *Deleted

## 2021-10-31 ENCOUNTER — Other Ambulatory Visit: Payer: Self-pay

## 2021-10-31 ENCOUNTER — Ambulatory Visit: Payer: 59 | Admitting: *Deleted

## 2021-10-31 ENCOUNTER — Ambulatory Visit: Payer: 59 | Attending: Obstetrics and Gynecology

## 2021-10-31 VITALS — BP 98/67 | HR 115

## 2021-10-31 DIAGNOSIS — O99119 Other diseases of the blood and blood-forming organs and certain disorders involving the immune mechanism complicating pregnancy, unspecified trimester: Secondary | ICD-10-CM

## 2021-10-31 DIAGNOSIS — O99213 Obesity complicating pregnancy, third trimester: Secondary | ICD-10-CM

## 2021-10-31 DIAGNOSIS — E669 Obesity, unspecified: Secondary | ICD-10-CM

## 2021-10-31 DIAGNOSIS — O99112 Other diseases of the blood and blood-forming organs and certain disorders involving the immune mechanism complicating pregnancy, second trimester: Secondary | ICD-10-CM | POA: Diagnosis not present

## 2021-10-31 DIAGNOSIS — Z86711 Personal history of pulmonary embolism: Secondary | ICD-10-CM

## 2021-10-31 DIAGNOSIS — R638 Other symptoms and signs concerning food and fluid intake: Secondary | ICD-10-CM | POA: Insufficient documentation

## 2021-10-31 DIAGNOSIS — Z8759 Personal history of other complications of pregnancy, childbirth and the puerperium: Secondary | ICD-10-CM

## 2021-10-31 DIAGNOSIS — D6851 Activated protein C resistance: Secondary | ICD-10-CM | POA: Diagnosis present

## 2021-10-31 DIAGNOSIS — Z3A31 31 weeks gestation of pregnancy: Secondary | ICD-10-CM

## 2021-10-31 DIAGNOSIS — O99113 Other diseases of the blood and blood-forming organs and certain disorders involving the immune mechanism complicating pregnancy, third trimester: Secondary | ICD-10-CM

## 2021-10-31 DIAGNOSIS — I871 Compression of vein: Secondary | ICD-10-CM

## 2021-10-31 DIAGNOSIS — O09293 Supervision of pregnancy with other poor reproductive or obstetric history, third trimester: Secondary | ICD-10-CM | POA: Diagnosis not present

## 2021-11-12 NOTE — L&D Delivery Note (Signed)
Delivery Note Labor onset: 12/25/2021  Labor Onset Time: 1504 Complete dilation at 1:08 AM  Onset of pushing at 1:08 FHR second stage Cat 2 Analgesia/Anesthesia intrapartum: none  Guided pushing with maternal urge. Delivery of a viable female at 82. Fetal head delivered in LOA position with compound hand Nuchal cord: x2.  Infant placed on maternal abd, dried, and tactile stim.  Cord double clamped after 3 minutes and cut by FOB.  3 Rns and NICU present for birth.  Cord blood sample collected: yes Arterial cord blood sample collected: no  Placenta delivered Schultz intact, with 3 VC.  Placenta to L&D. Uterine tone firm, bleeding stable  2nd degree laceration identified.  Anesthesia: lidocaine Repaired with 3-0 in usual fashion QBL/EBL (mL): 989 Complications: none APGAR: APGAR (1 MIN): 8   APGAR (5 MINS): 9   APGAR (10 MINS):   Mom to postpartum.  Baby to Couplet care / Skin to Skin.  Kathalene Frames MSN, CNM 12/26/2021, 2:59 AM

## 2021-11-24 LAB — OB RESULTS CONSOLE GBS: GBS: NEGATIVE

## 2021-11-27 ENCOUNTER — Encounter: Payer: Self-pay | Admitting: Hematology & Oncology

## 2021-11-27 ENCOUNTER — Other Ambulatory Visit: Payer: Self-pay

## 2021-11-27 ENCOUNTER — Inpatient Hospital Stay: Payer: 59 | Attending: Hematology & Oncology

## 2021-11-27 ENCOUNTER — Inpatient Hospital Stay: Payer: 59 | Admitting: Hematology & Oncology

## 2021-11-27 VITALS — BP 108/69 | HR 95 | Temp 97.7°F | Resp 18 | Ht 69.0 in | Wt 278.0 lb

## 2021-11-27 DIAGNOSIS — Z86718 Personal history of other venous thrombosis and embolism: Secondary | ICD-10-CM | POA: Insufficient documentation

## 2021-11-27 DIAGNOSIS — D6851 Activated protein C resistance: Secondary | ICD-10-CM

## 2021-11-27 DIAGNOSIS — Z86711 Personal history of pulmonary embolism: Secondary | ICD-10-CM | POA: Diagnosis not present

## 2021-11-27 DIAGNOSIS — I871 Compression of vein: Secondary | ICD-10-CM | POA: Diagnosis not present

## 2021-11-27 LAB — CBC WITH DIFFERENTIAL (CANCER CENTER ONLY)
Abs Immature Granulocytes: 0.44 10*3/uL — ABNORMAL HIGH (ref 0.00–0.07)
Basophils Absolute: 0 10*3/uL (ref 0.0–0.1)
Basophils Relative: 0 %
Eosinophils Absolute: 0.1 10*3/uL (ref 0.0–0.5)
Eosinophils Relative: 1 %
HCT: 36 % (ref 36.0–46.0)
Hemoglobin: 11.7 g/dL — ABNORMAL LOW (ref 12.0–15.0)
Immature Granulocytes: 4 %
Lymphocytes Relative: 22 %
Lymphs Abs: 2.3 10*3/uL (ref 0.7–4.0)
MCH: 27.8 pg (ref 26.0–34.0)
MCHC: 32.5 g/dL (ref 30.0–36.0)
MCV: 85.5 fL (ref 80.0–100.0)
Monocytes Absolute: 0.9 10*3/uL (ref 0.1–1.0)
Monocytes Relative: 8 %
Neutro Abs: 6.9 10*3/uL (ref 1.7–7.7)
Neutrophils Relative %: 65 %
Platelet Count: 198 10*3/uL (ref 150–400)
RBC: 4.21 MIL/uL (ref 3.87–5.11)
RDW: 16.5 % — ABNORMAL HIGH (ref 11.5–15.5)
WBC Count: 10.6 10*3/uL — ABNORMAL HIGH (ref 4.0–10.5)
nRBC: 0 % (ref 0.0–0.2)

## 2021-11-27 LAB — CMP (CANCER CENTER ONLY)
ALT: 13 U/L (ref 0–44)
AST: 10 U/L — ABNORMAL LOW (ref 15–41)
Albumin: 3.8 g/dL (ref 3.5–5.0)
Alkaline Phosphatase: 81 U/L (ref 38–126)
Anion gap: 10 (ref 5–15)
BUN: 7 mg/dL (ref 6–20)
CO2: 21 mmol/L — ABNORMAL LOW (ref 22–32)
Calcium: 9.8 mg/dL (ref 8.9–10.3)
Chloride: 105 mmol/L (ref 98–111)
Creatinine: 0.77 mg/dL (ref 0.44–1.00)
GFR, Estimated: >60 mL/min (ref >60)
Glucose, Bld: 89 mg/dL (ref 70–99)
Potassium: 4.3 mmol/L (ref 3.5–5.1)
Sodium: 136 mmol/L (ref 135–145)
Total Bilirubin: 0.3 mg/dL (ref 0.3–1.2)
Total Protein: 7 g/dL (ref 6.5–8.1)

## 2021-11-27 NOTE — Progress Notes (Signed)
Hematology and Oncology Follow Up Visit  Heather Lloyd [id number] [birth year] 31 y.o. 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 Heather Lloyd is here today for follow-up.  Everything is going well with the pregnancy so far.  She is doing well on the Lovenox.  She has had a little bit of bleeding at the injection site from the Lovenox.  She thinks that the injection will be on February 13.  So far, her baby is already about 4 pounds.  Is a baby boy.  I think she sees the High Risk obstetrician in a couple weeks.  We will switch over to heparin.  We will switch her over when she sees the High Risk obstetrician.  I am not sure Heather Lloyd hold out until February 13.  She will have heparin 3 times a day.  This will be therapeutic for her.  Her appetite is doing well.  She has had very little swelling in the legs.  She has had no problems with bowels or bladder.  She has had no chest wall pain.  Is been no cough.  She has had no fever.  She got through the holiday season.  She was quite busy.  Overall, I would say her performance status is ECOG 0.    Medications:  Allergies as of 11/27/2021       Reactions   Codeine Hives, Swelling   Ibuprofen Other (See Comments)   Pt is on blood thinners        Medication List        Accurate as of November 27, 2021  1:41 PM. If you have any questions, ask your nurse or doctor.          doxylamine (Sleep) 25 MG tablet Commonly known as: UNISOM Take 25 mg by mouth at bedtime as needed.   enoxaparin 120 MG/0.8ML injection Commonly known as: LOVENOX INJECT ONE SYRINGE (120MG )  SUBCUTANEOUSLY EVERY 12  HOURS   polyethylene glycol 17 g  packet Commonly known as: MIRALAX / GLYCOLAX Take 17 g by mouth daily as needed.   prenatal multivitamin Tabs tablet Take 1 tablet by mouth daily at 12 noon.        Allergies:  Allergies  Allergen Reactions   Codeine Hives and Swelling   Ibuprofen Other (See Comments)    Pt is on blood thinners     Past Medical History, Surgical history, Social history, and Family History were reviewed and updated.  Review of Systems: Review of Systems  Constitutional: Negative.   HENT: Negative.    Eyes: Negative.   Respiratory: Negative.    Cardiovascular: Negative.   Gastrointestinal: Negative.   Genitourinary: Negative.   Musculoskeletal: Negative.   Skin: Negative.   Neurological: Negative.   Endo/Heme/Allergies: Negative.   Psychiatric/Behavioral: Negative.      Physical Exam:  height is 5\' 9"  (1.753 m) and weight is 278 lb (126.1 kg). Her oral temperature is 97.7 F (36.5 C). Her blood pressure is 108/69 and her pulse is 95. Her respiration is 18 and oxygen saturation is 100%.   Wt Readings from Last 3 Encounters:  11/27/21 278 lb (126.1 kg)  10/02/21 276 lb (125.2 kg)  07/31/21 264  lb 1.3 oz (119.8 kg)    Physical Exam Vitals reviewed.  HENT:     Head: Normocephalic and atraumatic.  Eyes:     Pupils: Pupils are equal, round, and reactive to light.  Cardiovascular:     Rate and Rhythm: Normal rate and regular rhythm.     Heart sounds: Normal heart sounds.  Pulmonary:     Effort: Pulmonary effort is normal.     Breath sounds: Normal breath sounds.  Abdominal:     General: Bowel sounds are normal.     Comments: Abdominal exam shows a pregnant abdomen.  She has decent bowel sounds.  There is some firmness about the umbilicus.  She has no obvious fluid wave.  She has no palpable liver or spleen tip.  Musculoskeletal:        General: No tenderness or deformity. Normal range of motion.     Cervical back: Normal range of motion.     Comments: Extremity shows no  clubbing, cyanosis or edema.  She has very little swelling in the lower legs.  She has good strength.  She has good range of motion.  Lymphadenopathy:     Cervical: No cervical adenopathy.  Skin:    General: Skin is warm and dry.     Findings: No erythema or rash.  Neurological:     Mental Status: She is alert and oriented to person, place, and time.  Psychiatric:        Behavior: Behavior normal.        Thought Content: Thought content normal.        Judgment: Judgment normal.    Lab Results  Component Value Date   WBC 10.6 (H) 11/27/2021   HGB 11.7 (L) 11/27/2021   HCT 36.0 11/27/2021   MCV 85.5 11/27/2021   PLT 198 11/27/2021   No results found for: FERRITIN, IRON, TIBC, UIBC, IRONPCTSAT Lab Results  Component Value Date   RETICCTPCT 2.24 (H) 04/25/2017   RBC 4.21 11/27/2021   RETICCTABS 105.73 (H) 04/25/2017   No results found for: KPAFRELGTCHN, LAMBDASER, KAPLAMBRATIO No results found for: IGGSERUM, IGA, IGMSERUM No results found for: Heather Lloyd, SPEI   Chemistry      Component Value Date/Time   NA 136 11/27/2021 1242   NA 140 04/25/2017 1550   K 4.3 11/27/2021 1242   K 3.8 04/25/2017 1550   CL 105 11/27/2021 1242   CO2 21 (L) 11/27/2021 1242   CO2 26 04/25/2017 1550   BUN 7 11/27/2021 1242   BUN 12.5 04/25/2017 1550   CREATININE 0.77 11/27/2021 1242   CREATININE 1.0 04/25/2017 1550      Component Value Date/Time   CALCIUM 9.8 11/27/2021 1242   CALCIUM 9.6 04/25/2017 1550   ALKPHOS 81 11/27/2021 1242   ALKPHOS 60 04/25/2017 1550   AST 10 (L) 11/27/2021 1242   AST 23 04/25/2017 1550   ALT 13 11/27/2021 1242   ALT 28 04/25/2017 1550   BILITOT 0.3 11/27/2021 1242   BILITOT 0.48 04/25/2017 1550       Impression and Plan: Ms. 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    So far, her pregnancy is going well.  I am surprised that she will go as long as February 13.  Again, we will switch her over to heparin.  She will start this after she sees the high risk obstetrician in a couple weeks.  I probably will now plan to see her back after she has her baby.  Once she has her baby, she will get back onto Lovenox.  She will be on Lovenox as she breast-feeds.  Obviously if we can switch her over to her oral agent while she is breast-feeding.  So far everything is going well.  I am glad that the baby is developing nicely.  I am glad that Ms. Menees has had no problems with the pregnancy.  She has had no issues with thromboembolic disease.   Volanda Napoleon, MD 1/16/20231:41 PM

## 2021-12-04 ENCOUNTER — Other Ambulatory Visit: Payer: Self-pay | Admitting: *Deleted

## 2021-12-04 ENCOUNTER — Other Ambulatory Visit: Payer: Self-pay | Admitting: Hematology & Oncology

## 2021-12-04 ENCOUNTER — Telehealth: Payer: Self-pay | Admitting: *Deleted

## 2021-12-04 MED ORDER — HEPARIN SODIUM (PORCINE) 10000 UNIT/ML IJ SOLN: 10000.0000 [IU] | Freq: Two times a day (BID) | INTRAMUSCULAR | 0 refills | Status: DC

## 2021-12-04 NOTE — Telephone Encounter (Signed)
Patient called stating that [facility] does not have her heparin syringes and needed to order them also said that they felt her prefilled syringes may not be covered.  Patient asked if I could send to [facility] where she gets her Lovenox filled to see if they can fill this as ordered.

## 2021-12-05 ENCOUNTER — Encounter: Payer: Self-pay | Admitting: *Deleted

## 2021-12-05 ENCOUNTER — Ambulatory Visit: Payer: 59 | Admitting: *Deleted

## 2021-12-05 ENCOUNTER — Ambulatory Visit: Payer: 59 | Attending: Maternal & Fetal Medicine

## 2021-12-05 ENCOUNTER — Ambulatory Visit: Payer: 59 | Attending: Obstetrics | Admitting: Obstetrics

## 2021-12-05 ENCOUNTER — Other Ambulatory Visit: Payer: Self-pay

## 2021-12-05 ENCOUNTER — Telehealth: Payer: Self-pay

## 2021-12-05 ENCOUNTER — Other Ambulatory Visit: Payer: Self-pay | Admitting: Maternal & Fetal Medicine

## 2021-12-05 VITALS — BP 126/71 | HR 93

## 2021-12-05 DIAGNOSIS — O99113 Other diseases of the blood and blood-forming organs and certain disorders involving the immune mechanism complicating pregnancy, third trimester: Secondary | ICD-10-CM

## 2021-12-05 DIAGNOSIS — Z3A36 36 weeks gestation of pregnancy: Secondary | ICD-10-CM

## 2021-12-05 DIAGNOSIS — I871 Compression of vein: Secondary | ICD-10-CM

## 2021-12-05 DIAGNOSIS — O2293 Venous complication in pregnancy, unspecified, third trimester: Secondary | ICD-10-CM | POA: Diagnosis not present

## 2021-12-05 DIAGNOSIS — Z86711 Personal history of pulmonary embolism: Secondary | ICD-10-CM | POA: Diagnosis present

## 2021-12-05 DIAGNOSIS — Z8759 Personal history of other complications of pregnancy, childbirth and the puerperium: Secondary | ICD-10-CM

## 2021-12-05 DIAGNOSIS — Z86718 Personal history of other venous thrombosis and embolism: Secondary | ICD-10-CM

## 2021-12-05 DIAGNOSIS — O09293 Supervision of pregnancy with other poor reproductive or obstetric history, third trimester: Secondary | ICD-10-CM

## 2021-12-05 DIAGNOSIS — D6851 Activated protein C resistance: Secondary | ICD-10-CM | POA: Diagnosis present

## 2021-12-05 DIAGNOSIS — O99213 Obesity complicating pregnancy, third trimester: Secondary | ICD-10-CM | POA: Diagnosis present

## 2021-12-05 DIAGNOSIS — O99119 Other diseases of the blood and blood-forming organs and certain disorders involving the immune mechanism complicating pregnancy, unspecified trimester: Secondary | ICD-10-CM

## 2021-12-05 DIAGNOSIS — D6859 Other primary thrombophilia: Secondary | ICD-10-CM

## 2021-12-05 NOTE — Progress Notes (Signed)
MFM Note  Heather Lloyd was seen for a follow up growth scan and BPP due to maternal obesity and thrombophilia with a history of thrombosis.  She remains on treatment with therapeutic Lovenox.  She reports that her hematologist is planning to switch her to unfractionated heparin 10,000 units twice a day, this week.  She denies any problems since her last exam and reports that she has screened negative for gestational diabetes.  She was informed that the fetal growth measures large for her gestational age (EFW 8 pounds 9 ounces, greater than 99th percentile).  The patient reports that both she and her husband are very large people.  There was normal amniotic fluid noted.  A BPP performed today was 8 out of 8.  Due to her underlying medical condition, we will continue to follow her with weekly fetal testing until delivery.  Another BPP was scheduled in 1 week.  The patient already has an induction of labor scheduled on December 25, 2021.  She was advised to continue anticoagulation as recommended by her hematologist until the day prior to her delivery.    Due to her significant risk of thrombosis and thrombophilia, she will require anticoagulation for at least 6 weeks after delivery.  Either Lovenox or Coumadin may be used in the postpartum period.  Both medications are safe for breast-feeding.  She will return in 1 week for another BPP.  A total of 20 minutes was spent counseling and coordinating the care for this patient.  Greater than 50% of the time was spent in direct face-to-face contact.

## 2021-12-05 NOTE — Telephone Encounter (Signed)
Called and cancelled heparin order at [facility], rx is now through [facility] per pt request.   Fax from [facility] stating prefilled syringes only come in 5000 unit, called and informed patient if she wanted the prefilled syringes she would have to do 2 of them. Patient declined and would like to just use the vials. She understands we can have her come in to our office once she receives them to do teaching. She will call as soon as they come in.

## 2021-12-06 ENCOUNTER — Other Ambulatory Visit: Payer: Self-pay | Admitting: *Deleted

## 2021-12-06 ENCOUNTER — Other Ambulatory Visit: Payer: Self-pay

## 2021-12-06 DIAGNOSIS — O99119 Other diseases of the blood and blood-forming organs and certain disorders involving the immune mechanism complicating pregnancy, unspecified trimester: Secondary | ICD-10-CM

## 2021-12-06 DIAGNOSIS — D6851 Activated protein C resistance: Secondary | ICD-10-CM

## 2021-12-06 MED ORDER — HEPARIN SODIUM (PORCINE) 10000 UNIT/ML IJ SOLN: 10000.0000 [IU] | Freq: Two times a day (BID) | INTRAMUSCULAR | 0 refills | Status: DC

## 2021-12-11 ENCOUNTER — Telehealth (HOSPITAL_COMMUNITY): Payer: Self-pay | Admitting: *Deleted

## 2021-12-11 NOTE — Telephone Encounter (Signed)
Preadmission screen  

## 2021-12-12 ENCOUNTER — Telehealth (HOSPITAL_COMMUNITY): Payer: Self-pay | Admitting: *Deleted

## 2021-12-12 NOTE — Telephone Encounter (Signed)
Preadmission screen  

## 2021-12-13 ENCOUNTER — Telehealth (HOSPITAL_COMMUNITY): Payer: Self-pay | Admitting: *Deleted

## 2021-12-13 ENCOUNTER — Ambulatory Visit: Payer: 59 | Admitting: *Deleted

## 2021-12-13 ENCOUNTER — Encounter (HOSPITAL_COMMUNITY): Payer: Self-pay | Admitting: *Deleted

## 2021-12-13 ENCOUNTER — Other Ambulatory Visit: Payer: Self-pay

## 2021-12-13 ENCOUNTER — Ambulatory Visit: Payer: 59 | Attending: Obstetrics

## 2021-12-13 ENCOUNTER — Encounter (HOSPITAL_COMMUNITY): Payer: Self-pay

## 2021-12-13 VITALS — BP 111/62 | HR 108

## 2021-12-13 DIAGNOSIS — E669 Obesity, unspecified: Secondary | ICD-10-CM | POA: Diagnosis not present

## 2021-12-13 DIAGNOSIS — O99113 Other diseases of the blood and blood-forming organs and certain disorders involving the immune mechanism complicating pregnancy, third trimester: Secondary | ICD-10-CM | POA: Diagnosis not present

## 2021-12-13 DIAGNOSIS — D6859 Other primary thrombophilia: Secondary | ICD-10-CM | POA: Diagnosis present

## 2021-12-13 DIAGNOSIS — O99119 Other diseases of the blood and blood-forming organs and certain disorders involving the immune mechanism complicating pregnancy, unspecified trimester: Secondary | ICD-10-CM | POA: Diagnosis present

## 2021-12-13 DIAGNOSIS — Z3A37 37 weeks gestation of pregnancy: Secondary | ICD-10-CM

## 2021-12-13 DIAGNOSIS — O99213 Obesity complicating pregnancy, third trimester: Secondary | ICD-10-CM

## 2021-12-13 NOTE — Telephone Encounter (Signed)
Preadmission screen  

## 2021-12-18 DIAGNOSIS — Z8719 Personal history of other diseases of the digestive system: Secondary | ICD-10-CM

## 2021-12-18 DIAGNOSIS — I82529 Chronic embolism and thrombosis of unspecified iliac vein: Secondary | ICD-10-CM

## 2021-12-18 DIAGNOSIS — E282 Polycystic ovarian syndrome: Secondary | ICD-10-CM

## 2021-12-18 DIAGNOSIS — Z90721 Acquired absence of ovaries, unilateral: Secondary | ICD-10-CM

## 2021-12-18 DIAGNOSIS — R7303 Prediabetes: Secondary | ICD-10-CM

## 2021-12-18 DIAGNOSIS — E669 Obesity, unspecified: Secondary | ICD-10-CM

## 2021-12-18 NOTE — H&P (Signed)
HPI: 31 y.o. G1P0 @ [redacted]w[redacted]d estimated gestational age (as dated by LMP c/w 6 week ultrasound) presents for induction of labor for Factor V Leiden Deficiency (heterozygous) and Antithrombin III Deficiency with personal history of DVT/PE (2009) on anticoagulation. Currently, patient taking Heparin 10,000 units BID.  Leakage of fluid:  No Vaginal bleeding:  No Contractions:  No Fetal movement:  Yes  Prenatal care has been provided by Dr. Drema Dallas [facility]).  ROS:  Denies fevers, chills, chest pain, visual changes, SOB, RUQ/epigastric pain, N/V, dysuria, hematuria, or sudden onset/worsening bilateral LE or facial edema.  Pregnancy complicated by: Factor V Leiden Deficiency (heterozygous) and Antithrombin III Deficiency with personal history of DVT/PE in 2009 (see below) May Thurner syndrome (on anticoagulation) Chronic thrombosis of left iliac vein (unsuccessful previous attempt at removal of thrombosis) Suspected fetal macrosomia (EFW >99%ile for growth) Obesity (BMI 38) Pre-diabetes (early HgbA1C 5.3) PCOS Hx of right dermoid cyst (s/p laparoscopic right oophorectomy) Hx of diverticulitis   Prenatal Transfer Tool  Maternal Diabetes: No Genetic Screening: Normal Maternal Ultrasounds/Referrals: Normal Fetal Ultrasounds or other Referrals:  Referred to Materal Fetal Medicine  Maternal Substance Abuse:  No Significant Maternal Medications:  Meds include: Other: Heparin 10,000units BID Significant Maternal Lab Results: Group B Strep negative   Prenatal Labs Blood type:  A Positive Antibody screen:  Negative CBC:  H/H 11.4/33.4 Rubella: Immune RPR:  Non-reactive Hep B:  Negative Hep C:  Negative HIV:  Negative GC/CT:  Negative Glucola:  117.0 (wnl)  Immunizations: Tdap: Given prenatally Flu: Declines  OBHx:  OB History     Gravida  1   Para      Term      Preterm      AB      Living         SAB      IAB      Ectopic      Multiple      Live  Births             PMHx:  See above Meds:  PNV, Heparin 10,000 units BID, ASA 81mg  daily Allergy:   Allergies  Allergen Reactions   Codeine Hives and Swelling   Ibuprofen Other (See Comments)    Pt is on blood thinners    SurgHx: Hx laparoscopic right oophorectomy, tonsillectomy, attempt at thrombus extraction of left iliac vein SocHx:   Denies Tobacco, ETOH, illicit drugs  O: LMP 87/68/1157  Gen. AAOx3, NAD CV.  RRR  Resp. CTAB, no wheezes/rales/rhonchi Abd. Gravid, soft, non-tender throughout, no rebound/guarding Extr.  Trace bilateral LE edema, no calf tenderness bilaterally SVE: closed/50/high   Last growth [place] (12/05/21): EFW 3871g, 8lb 9 oz (>26%), AAFV, cephalic, anterior placenta   Labs: see orders  A/P:  31 y.o. G1P0 @ [redacted]w[redacted]d who is admitted for induction of labor for Factor V Leiden Deficiency (heterozygous) and Antithrombin III Deficiency with personal history of DVT/PE (2009) on anticoagulation.  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 Marin Olp) - Vascular surgeon: [facility]  - Medication: Lovenox 120mg  BID --> Heparin 10,000 units BID - Vascular surgery previously stated that there is no contraindication to pregnancy. States she  has large abdominal wall collaterals (FYI in the event she requires CS). They do not recommend left iliac stent at this time. - Serial Xa levels (by Heme/Onc): 10/02/21 0.51 (therapeutic)  Drema Dallas, DO [phone] (office)

## 2021-12-20 ENCOUNTER — Other Ambulatory Visit: Payer: Self-pay

## 2021-12-20 ENCOUNTER — Ambulatory Visit: Payer: 59 | Admitting: *Deleted

## 2021-12-20 ENCOUNTER — Ambulatory Visit: Payer: 59 | Attending: Obstetrics

## 2021-12-20 ENCOUNTER — Encounter: Payer: Self-pay | Admitting: Hematology & Oncology

## 2021-12-20 VITALS — BP 110/70 | HR 102

## 2021-12-20 DIAGNOSIS — Z3A38 38 weeks gestation of pregnancy: Secondary | ICD-10-CM | POA: Diagnosis not present

## 2021-12-20 DIAGNOSIS — D6859 Other primary thrombophilia: Secondary | ICD-10-CM

## 2021-12-20 DIAGNOSIS — O99213 Obesity complicating pregnancy, third trimester: Secondary | ICD-10-CM

## 2021-12-20 DIAGNOSIS — O99119 Other diseases of the blood and blood-forming organs and certain disorders involving the immune mechanism complicating pregnancy, unspecified trimester: Secondary | ICD-10-CM | POA: Insufficient documentation

## 2021-12-20 DIAGNOSIS — O99113 Other diseases of the blood and blood-forming organs and certain disorders involving the immune mechanism complicating pregnancy, third trimester: Secondary | ICD-10-CM

## 2021-12-21 ENCOUNTER — Other Ambulatory Visit: Payer: Self-pay

## 2021-12-21 DIAGNOSIS — D6851 Activated protein C resistance: Secondary | ICD-10-CM

## 2021-12-21 MED ORDER — ENOXAPARIN SODIUM 120 MG/0.8ML IJ SOSY: 120.0000 mg | PREFILLED_SYRINGE | Freq: Two times a day (BID) | INTRAMUSCULAR | 0 refills | Status: DC

## 2021-12-22 ENCOUNTER — Other Ambulatory Visit: Payer: Self-pay | Admitting: Obstetrics and Gynecology

## 2021-12-22 LAB — SARS CORONAVIRUS 2 (TAT 6-24 HRS): SARS Coronavirus 2: NEGATIVE

## 2021-12-25 ENCOUNTER — Encounter (HOSPITAL_COMMUNITY): Payer: Self-pay | Admitting: Obstetrics and Gynecology

## 2021-12-25 ENCOUNTER — Inpatient Hospital Stay (HOSPITAL_COMMUNITY)
Admission: AD | Admit: 2021-12-25 | Discharge: 2021-12-28 | DRG: 806 | Disposition: A | Discharge status: Home / Self Care | Payer: 59 | Attending: Obstetrics and Gynecology | Admitting: Obstetrics and Gynecology

## 2021-12-25 ENCOUNTER — Inpatient Hospital Stay (HOSPITAL_COMMUNITY): Payer: 59

## 2021-12-25 ENCOUNTER — Other Ambulatory Visit: Payer: Self-pay

## 2021-12-25 DIAGNOSIS — O99214 Obesity complicating childbirth: Secondary | ICD-10-CM | POA: Diagnosis present

## 2021-12-25 DIAGNOSIS — O871 Deep phlebothrombosis in the puerperium: Secondary | ICD-10-CM | POA: Diagnosis present

## 2021-12-25 DIAGNOSIS — Z90721 Acquired absence of ovaries, unilateral: Secondary | ICD-10-CM

## 2021-12-25 DIAGNOSIS — O870 Superficial thrombophlebitis in the puerperium: Secondary | ICD-10-CM | POA: Diagnosis present

## 2021-12-25 DIAGNOSIS — D6851 Activated protein C resistance: Secondary | ICD-10-CM | POA: Diagnosis present

## 2021-12-25 DIAGNOSIS — I82529 Chronic embolism and thrombosis of unspecified iliac vein: Secondary | ICD-10-CM

## 2021-12-25 DIAGNOSIS — R7303 Prediabetes: Secondary | ICD-10-CM | POA: Diagnosis present

## 2021-12-25 DIAGNOSIS — Z7901 Long term (current) use of anticoagulants: Secondary | ICD-10-CM | POA: Diagnosis not present

## 2021-12-25 DIAGNOSIS — Z3A39 39 weeks gestation of pregnancy: Secondary | ICD-10-CM

## 2021-12-25 DIAGNOSIS — O99892 Other specified diseases and conditions complicating childbirth: Secondary | ICD-10-CM | POA: Diagnosis present

## 2021-12-25 DIAGNOSIS — Z86711 Personal history of pulmonary embolism: Secondary | ICD-10-CM | POA: Diagnosis not present

## 2021-12-25 DIAGNOSIS — Z8719 Personal history of other diseases of the digestive system: Secondary | ICD-10-CM

## 2021-12-25 DIAGNOSIS — E669 Obesity, unspecified: Secondary | ICD-10-CM

## 2021-12-25 DIAGNOSIS — O9912 Other diseases of the blood and blood-forming organs and certain disorders involving the immune mechanism complicating childbirth: Secondary | ICD-10-CM | POA: Diagnosis present

## 2021-12-25 DIAGNOSIS — E282 Polycystic ovarian syndrome: Secondary | ICD-10-CM

## 2021-12-25 LAB — CBC
HCT: 35.9 % — ABNORMAL LOW (ref 36.0–46.0)
Hemoglobin: 11.6 g/dL — ABNORMAL LOW (ref 12.0–15.0)
MCH: 27.3 pg (ref 26.0–34.0)
MCHC: 32.3 g/dL (ref 30.0–36.0)
MCV: 84.5 fL (ref 80.0–100.0)
Platelets: 209 10*3/uL (ref 150–400)
RBC: 4.25 MIL/uL (ref 3.87–5.11)
RDW: 15.9 % — ABNORMAL HIGH (ref 11.5–15.5)
WBC: 13 10*3/uL — ABNORMAL HIGH (ref 4.0–10.5)
nRBC: 0 % (ref 0.0–0.2)

## 2021-12-25 LAB — RPR: RPR Ser Ql: NONREACTIVE

## 2021-12-25 LAB — PREPARE RBC (CROSSMATCH)

## 2021-12-25 MED ORDER — LIDOCAINE HCL (PF) 1 % IJ SOLN
30.0000 mL | INTRAMUSCULAR | Status: AC | PRN
  Administered 2021-12-26: 30 mL via SUBCUTANEOUS
  Filled 2021-12-25: qty 30

## 2021-12-25 MED ORDER — OXYTOCIN-SODIUM CHLORIDE 30-0.9 UT/500ML-% IV SOLN
1.0000 m[IU]/min | INTRAVENOUS | Status: DC
  Administered 2021-12-25: 2 m[IU]/min via INTRAVENOUS
  Filled 2021-12-25: qty 500

## 2021-12-25 MED ORDER — DIPHENHYDRAMINE HCL 50 MG/ML IJ SOLN: 12.5000 mg | INTRAMUSCULAR | Status: DC | PRN

## 2021-12-25 MED ORDER — EPHEDRINE 5 MG/ML INJ: 10.0000 mg | INTRAVENOUS | Status: DC | PRN

## 2021-12-25 MED ORDER — ACETAMINOPHEN 325 MG PO TABS: 650.0000 mg | ORAL_TABLET | ORAL | Status: DC | PRN

## 2021-12-25 MED ORDER — ONDANSETRON HCL 4 MG/2ML IJ SOLN
4.0000 mg | Freq: Four times a day (QID) | INTRAMUSCULAR | Status: DC | PRN
  Administered 2021-12-25: 4 mg via INTRAVENOUS
  Filled 2021-12-25: qty 2

## 2021-12-25 MED ORDER — LACTATED RINGERS IV SOLN: 500.0000 mL | INTRAVENOUS | Status: DC | PRN

## 2021-12-25 MED ORDER — LACTATED RINGERS IV SOLN: 500.0000 mL | Freq: Once | INTRAVENOUS | Status: DC

## 2021-12-25 MED ORDER — OXYTOCIN-SODIUM CHLORIDE 30-0.9 UT/500ML-% IV SOLN: 2.5000 [IU]/h | INTRAVENOUS | Status: DC

## 2021-12-25 MED ORDER — OXYTOCIN BOLUS FROM INFUSION
333.0000 mL | Freq: Once | INTRAVENOUS | Status: AC
  Administered 2021-12-26: 333 mL via INTRAVENOUS

## 2021-12-25 MED ORDER — LACTATED RINGERS IV SOLN: INTRAVENOUS | Status: DC

## 2021-12-25 MED ORDER — FENTANYL-BUPIVACAINE-NACL 0.5-0.125-0.9 MG/250ML-% EP SOLN: 12.0000 mL/h | EPIDURAL | Status: DC | PRN

## 2021-12-25 MED ORDER — MISOPROSTOL 25 MCG QUARTER TABLET
25.0000 ug | ORAL_TABLET | ORAL | Status: DC | PRN
  Administered 2021-12-25 (×3): 25 ug via VAGINAL
  Filled 2021-12-25 (×4): qty 1

## 2021-12-25 MED ORDER — TERBUTALINE SULFATE 1 MG/ML IJ SOLN: 0.2500 mg | Freq: Once | INTRAMUSCULAR | Status: DC | PRN

## 2021-12-25 MED ORDER — FENTANYL CITRATE (PF) 100 MCG/2ML IJ SOLN
50.0000 ug | INTRAMUSCULAR | Status: DC | PRN
  Administered 2021-12-25: 100 ug via INTRAVENOUS
  Filled 2021-12-25: qty 2

## 2021-12-25 MED ORDER — PHENYLEPHRINE 40 MCG/ML (10ML) SYRINGE FOR IV PUSH (FOR BLOOD PRESSURE SUPPORT): 80.0000 ug | PREFILLED_SYRINGE | INTRAVENOUS | Status: DC | PRN

## 2021-12-25 MED ORDER — SODIUM CHLORIDE 0.9% IV SOLUTION: Freq: Once | INTRAVENOUS | Status: DC

## 2021-12-25 MED ORDER — SOD CITRATE-CITRIC ACID 500-334 MG/5ML PO SOLN: 30.0000 mL | ORAL | Status: DC | PRN

## 2021-12-25 NOTE — Progress Notes (Signed)
OB Progress Note  S: Patient not really feeling pain with contractions. Consents to AROM  O: BP 119/71    Pulse 92    Temp 97.8 F (36.6 C) (Oral)    Resp 17    Ht 5\' 10"  (1.778 m)    Wt 128 kg    LMP 03/24/2021    BMI 40.49 kg/m   FHT: 145bpm, moderate variablity, + accels, - decels Toco: q1-2 minutes SVE: 5.5/60/-3, sutures palpated AROM: Clear, non-odorous, IUPC placed  A/P: 31 y.o. G1P0 @ [redacted]w[redacted]d admitted for  Factor V Leiden Deficiency (heterozygous) and Antithrombin III Deficiency with personal history of DVT/PE (2009).   FWB: Cat. I Labor course: S/p Cytotec vaginally x 2 doses, s/p FB, Pitocin at 24mU/min, AROM now - clear, non-odorous, IUPC placed Pain: Per patient request GBS: Negative Anticipate SVD  Drema Dallas, DO

## 2021-12-25 NOTE — Progress Notes (Signed)
Subjective:    Painful contractions, considering an epidural  Objective:    VS: BP (!) 104/91    Pulse (!) 142    Temp 98.1 F (36.7 C) (Oral)    Resp 16    Ht 5\' 10"  (1.778 m)    Wt 128 kg    LMP 03/24/2021    BMI 40.49 kg/m  FHR : baseline 150 / variability present / accelerations present / no decelerations Toco: contractions every 2-3 minutes  Membranes: AROM Dilation: 7.5 Effacement (%): 70, 80 Cervical Position: Middle Station: -2 Presentation: Vertex Exam by:: Raquel Sarna Sluaghterbeck CNM Pitocin 14 mU/min  Assessment/Plan:   31 y.o. G1P0 [redacted]w[redacted]d here for IOL for Factor V Leiden Deficiency (heterozygous) and Antithrombin III Deficiency with personal history of DVT/PE (2009).  Labor: Active labor in 64mU/min of pitocin. Will hold pitocin at 29mU/min. Fetal Wellbeing:  Category I Pain Control:  Labor support without medications. Considering epidural I/D:   GBS negative Anticipated MOD:  NSVD  Kathalene Frames MSN, CNM 12/25/2021 11:29 PM

## 2021-12-25 NOTE — Progress Notes (Signed)
OB Progress Note  S: Patient resting comfortably. She reports feeling gas pains but no significant pain with contractions. Consents to FB placement.  O: BP 109/60    Pulse 80    Temp 97.8 F (36.6 C) (Oral)    Resp 17    Ht 5\' 10"  (1.778 m)    Wt 128 kg    LMP 03/24/2021    BMI 40.49 kg/m   FHT: 145bpm, moderate variablity, + accels, - decels Toco: not graphing well SVE: 1.5/60/-3  A/P: 31 y.o. G1P0 @ [redacted]w[redacted]d admitted for induction of labor for Factor V Leiden Deficiency (heterozygous) and Antithrombin III Deficiency with personal history of DVT/PE (2009).  FWB: Cat. I Labor course: S/p Cytotec vaginally x 2 doses, FB placed now with balloon filled to 60cc Pain: Per patient request GBS: Negative Anticipate SVD  Drema Dallas, DO

## 2021-12-25 NOTE — Progress Notes (Signed)
OB Progress Note  S: Patient resting comfortably. She is not feeling any contractions.  O: BP 109/60    Pulse 80    Temp 97.8 F (36.6 C) (Oral)    Resp 17    Ht 5\' 10"  (1.778 m)    Wt 128 kg    LMP 03/24/2021    BMI 40.49 kg/m   FHT: 140bpm, moderate variablity, + accels, - decels Toco: none SVE: deferred, last SVE 1/50/-3 at 0525 by primary RN  A/P: 31 y.o. G1P0 @ [redacted]w[redacted]d admitted for induction of labor for Factor V Leiden Deficiency (heterozygous) and Antithrombin III Deficiency with personal history of DVT/PE (2009).  FWB: Cat. I Labor course: S/p Cytotec vaginally x 2 doses Pain: Per patient request GBS: Negative Anticipate SVD  Okay to have breakfast this AM. Will assess for cervical balloon at lunch time.  Drema Dallas, DO

## 2021-12-25 NOTE — Progress Notes (Signed)
Subjective:    Vocalizing with contractions. Requesting pain medication  Objective:    VS: BP 134/69    Pulse 84    Temp 98.5 F (36.9 C) (Oral)    Resp 18    Ht 5\' 10"  (1.778 m)    Wt 128 kg    LMP 03/24/2021    BMI 40.49 kg/m  FHR : baseline 150 / variability moderate / accelerations present / no decelerations Toco: contractions every 2-4 minutes  Membranes: AROM Dilation: 4.5 Effacement (%): 60 Cervical Position: Middle Station: -2 Presentation: Vertex Exam by:: Data processing manager RN Pitocin 10 mU/min  Assessment/Plan:   31 y.o. G1P0 [redacted]w[redacted]d here for IOL for Factor V Leiden Deficiency (heterozygous) and Antithrombin III Deficiency with personal history of DVT/PE (2009).  Labor: s/p cytotec x2 doses, foley bulb. Continues on pitocin. Cervix remains unchanged. Will replace IUPC if no change in 2 hours.Continue with pitocin 2x2 Preeclampsia:  labs stable Fetal Wellbeing:  Category I Pain Control:  Labor support without medications and IV pain meds I/D:   GBS negative Anticipated MOD:  NSVD  Kathalene Frames MSN, CNM 12/25/2021 9:47 PM

## 2021-12-26 ENCOUNTER — Encounter (HOSPITAL_COMMUNITY): Payer: Self-pay | Admitting: Obstetrics and Gynecology

## 2021-12-26 LAB — CBC
HCT: 33.5 % — ABNORMAL LOW (ref 36.0–46.0)
Hemoglobin: 11.2 g/dL — ABNORMAL LOW (ref 12.0–15.0)
MCH: 27.7 pg (ref 26.0–34.0)
MCHC: 33.4 g/dL (ref 30.0–36.0)
MCV: 82.7 fL (ref 80.0–100.0)
Platelets: 170 10*3/uL (ref 150–400)
RBC: 4.05 MIL/uL (ref 3.87–5.11)
RDW: 15.9 % — ABNORMAL HIGH (ref 11.5–15.5)
WBC: 22.6 10*3/uL — ABNORMAL HIGH (ref 4.0–10.5)
nRBC: 0 % (ref 0.0–0.2)

## 2021-12-26 LAB — TYPE AND SCREEN
ABO/RH(D): A POS
Antibody Screen: NEGATIVE
Unit division: 0
Unit division: 0

## 2021-12-26 LAB — BPAM RBC
Blood Product Expiration Date: 202303022359
Blood Product Expiration Date: 202303042359
Unit Type and Rh: 6200
Unit Type and Rh: 6200

## 2021-12-26 MED ORDER — SIMETHICONE 80 MG PO CHEW: 80.0000 mg | CHEWABLE_TABLET | ORAL | Status: DC | PRN

## 2021-12-26 MED ORDER — ACETAMINOPHEN 325 MG PO TABS
650.0000 mg | ORAL_TABLET | ORAL | Status: DC | PRN
  Administered 2021-12-26 – 2021-12-28 (×10): 650 mg via ORAL
  Filled 2021-12-26 (×10): qty 2

## 2021-12-26 MED ORDER — PRENATAL MULTIVITAMIN CH
1.0000 | ORAL_TABLET | Freq: Every day | ORAL | Status: DC
  Administered 2021-12-26 – 2021-12-28 (×3): 1 via ORAL
  Filled 2021-12-26 (×3): qty 1

## 2021-12-26 MED ORDER — SENNOSIDES-DOCUSATE SODIUM 8.6-50 MG PO TABS
2.0000 | ORAL_TABLET | Freq: Every day | ORAL | Status: DC
  Administered 2021-12-27 – 2021-12-28 (×2): 2 via ORAL
  Filled 2021-12-26 (×2): qty 2

## 2021-12-26 MED ORDER — ONDANSETRON HCL 4 MG PO TABS: 4.0000 mg | ORAL_TABLET | ORAL | Status: DC | PRN

## 2021-12-26 MED ORDER — BENZOCAINE-MENTHOL 20-0.5 % EX AERO
1.0000 "application " | INHALATION_SPRAY | CUTANEOUS | Status: DC | PRN
  Administered 2021-12-27: 1 via TOPICAL
  Filled 2021-12-26 (×2): qty 56

## 2021-12-26 MED ORDER — ENOXAPARIN SODIUM 120 MG/0.8ML IJ SOSY
120.0000 mg | PREFILLED_SYRINGE | Freq: Two times a day (BID) | INTRAMUSCULAR | Status: DC
  Administered 2021-12-26 – 2021-12-28 (×4): 120 mg via SUBCUTANEOUS
  Filled 2021-12-26 (×6): qty 0.8

## 2021-12-26 MED ORDER — DIPHENHYDRAMINE HCL 25 MG PO CAPS: 25.0000 mg | ORAL_CAPSULE | Freq: Four times a day (QID) | ORAL | Status: DC | PRN

## 2021-12-26 MED ORDER — ZOLPIDEM TARTRATE 5 MG PO TABS: 5.0000 mg | ORAL_TABLET | Freq: Every evening | ORAL | Status: DC | PRN

## 2021-12-26 MED ORDER — TETANUS-DIPHTH-ACELL PERTUSSIS 5-2.5-18.5 LF-MCG/0.5 IM SUSY: 0.5000 mL | PREFILLED_SYRINGE | Freq: Once | INTRAMUSCULAR | Status: DC

## 2021-12-26 MED ORDER — DIBUCAINE (PERIANAL) 1 % EX OINT: 1.0000 "application " | TOPICAL_OINTMENT | CUTANEOUS | Status: DC | PRN

## 2021-12-26 MED ORDER — WITCH HAZEL-GLYCERIN EX PADS
1.0000 "application " | MEDICATED_PAD | CUTANEOUS | Status: DC | PRN
  Administered 2021-12-27 – 2021-12-28 (×2): 1 via TOPICAL

## 2021-12-26 MED ORDER — ONDANSETRON HCL 4 MG/2ML IJ SOLN: 4.0000 mg | INTRAMUSCULAR | Status: DC | PRN

## 2021-12-26 MED ORDER — COCONUT OIL OIL: 1.0000 "application " | TOPICAL_OIL | Status: DC | PRN

## 2021-12-26 NOTE — Progress Notes (Addendum)
Postpartum Note Day #0  S:  Patient doing well.  Pain controlled.  Tolerating regular diet.   Ambulating and voiding without difficulty.   Denies fevers, chills, chest pain, SOB, N/V, or worsening bilateral LE edema.  Lochia: Minimal Infant feeding:  Breast Circumcision:  Desires prior to discharge Contraception:  None at this time  O: Temp:  [97.7 F (36.5 C)-98.9 F (37.2 C)] 98 F (36.7 C) (02/14 0539) Pulse Rate:  [79-185] 97 (02/14 0539) Resp:  [16-20] 16 (02/14 0539) BP: (97-148)/(50-108) 112/61 (02/14 0539) SpO2:  [97 %-99 %] 97 % (02/14 0539) Gen: NAD, pleasant and cooperative Resp: No increased work of breathing Abdomen: soft, non-distended, non-tender throughout Uterus: firm, non-tender, below umbilicus Ext: No bilateral LE edema, no bilateral calf tenderness  Labs:  Recent Labs    12/25/21 0057 12/26/21 0722  HGB 11.6* 11.2*    A/P: Patient is a 31 y.o. G1P1001 PPD#0 s/p SVD  S/p SVD - Pain well controlled  - GU: UOP is adequate - GI: Tolerating regular diet - Activity: encouraged sitting up to chair and ambulation as tolerated - DVT Prophylaxis: SCDs, Lovenox 120mg  BID start today - Labs: stable as above  Factor V Leiden Deficiency (heterozygous) and Antithrombin III Deficiency with personal history of DVT/PE (2009) - Diagnosed in 2009 due to DVT/PE (thrombophilia work-up completed at [facility] in [place]) - Has chronic left iliac thrombosis as a result because the clot was unable to be removed by vascular surgeons at that time - Has history of IVC filter that was placed and removed - Heme/Onc: [place] (Dr. Marin Olp) - Vascular surgeon: [facility]  - Medication: Lovenox 120mg  BID (ordered)  Circumcision consent: Routine circumcisions performed on newborns have been identified as voluntary, elective procedures by [employer] such as the [employer] of Pediatrics.  It is considered an elective procedure with no  definitive medical indication and carries risks.  Risks include but are not limited to bleeding, infection, damage to penis with possible need for further surgery, poor cosmesis, and local anesthetic risks.  Circumcision will only be performed if patient is deemed to have normal anatomy by his Pediatrician, meets adequate criteria for a newborn of similar gestational age after birth and is without infection or other medical issue contraindicating an elective procedure.   Patient understands and agrees with above consent Patient discussed with parents of infant   Disposition:  D/C home likely PPD#2   Drema Dallas, DO [phone] (office)

## 2021-12-26 NOTE — Lactation Note (Signed)
This note was copied from a baby's chart. Lactation Consultation Note  Patient Name: Heather Lloyd [name] Date: 12/26/2021 Reason for consult: Follow-up assessment;1st time breastfeeding;Primapara;Difficult latch;Other (Comment) (2nd [name] visit to review the DEBP and check the flange size. #24 F a good comfortable fit per mom. Baby woke up sucking on fingers and LC assisted mom to latch on the right breast while pumping [name] LC finsihed the consult for supplmenting) Age:69 hours  Maternal Data Has patient been taught Hand Expression?: Yes (several small drops of colostrum expressed and baby mouthed them off the nipple)  Feeding Mother's Current Feeding Choice: Breast Milk  LATCH Score Latch: Grasps breast easily, tongue down, lips flanged, rhythmical sucking.  Audible Swallowing: None  Type of Nipple: Everted at rest and after stimulation  Comfort (Breast/Nipple): Soft / non-tender  Hold (Positioning): Assistance needed to correctly position infant at breast and maintain latch.  LATCH Score: 7   Lactation Tools Discussed/Used Tools: Flanges;Pump;Nipple Heather Lloyd Size: 24 Breast pump type: Double-Electric Breast Pump;Manual Pump Education: Setup, frequency, and cleaning Reason for Pumping: due to Difficult latch and poor latch Pumped volume: 0 mL  Interventions Interventions: Breast feeding basics reviewed;Assisted with latch;Skin to skin;Adjust position;Support pillows;Position options;Shells;DEBP;Hand pump;Education  Discharge    Consult Status Consult Status: Follow-up Date: 12/26/21 Follow-up type: In-patient    [place] 12/26/2021, 3:51 PM

## 2021-12-26 NOTE — Lactation Note (Signed)
This note was copied from a baby's chart. Lactation Consultation Note  Patient Name: Heather Lloyd [id number] Date: 12/26/2021 Reason for consult: Term;Primapara;1st time breastfeeding;Difficult latch;Breastfeeding assistance;Other (Comment) Age:30 hours Louise notified  [place] baby was a difficult latch and mom requesting to supplement.  LC checked the diaper / small wet , changed the diaper, and placed baby STS on the right breast. Noted areola edema , semi compressible. Per mom aware she has Flat nipples . LC reassured mom with reverse pressure exercise and pumping the areolas will become more compressible for latch.  Baby awake enough to attempt to latch for a few sucks and was unable to latch over the nipple/areola complex for a deep latch.  Mom needed to use the bathroom and then needs to have DEBP setr up.  Maternal Data Has patient been taught Hand Expression?: Yes (several small drops of colostrum expressed and baby mouthed them off the nipple)  Feeding Mother's Current Feeding Choice: Breast Milk  LATCH Score Latch: Repeated attempts needed to sustain latch, nipple held in mouth throughout feeding, stimulation needed to elicit sucking reflex. (few sucks)  Audible Swallowing: None  Type of Nipple: Flat (compressible areolas with some edema - reverse pressure helped to soften and make the areola compressible)  Comfort (Breast/Nipple): Soft / non-tender  Hold (Positioning): Full assist, staff holds infant at breast  LATCH Score: 4   Lactation Tools Discussed/Used Tools: Pump (DEBP was already set up) Breast pump type: Double-Electric Breast Pump;Manual Reason for Pumping: due to Difficult latch and poor latch  Interventions Interventions: Breast feeding basics reviewed;Assisted with latch;Skin to skin;Breast massage;Hand express;Reverse pressure;Support pillows;Adjust position;DEBP;Education  Discharge    Consult Status Consult Status: Follow-up Date:  12/26/21 Follow-up type: In-patient    [place] 12/26/2021, 3:05 PM

## 2021-12-26 NOTE — Lactation Note (Signed)
This note was copied from a baby's chart. Lactation Consultation Note Mom on MBU stating she is struggling to BF. Baby is unable to maintain latch.  Breast is compressible and baby is able to latch for a short time then comes off.  Mom requesting NS. Fitted mom for #24. Demonstrated application. [place] doesn't feel that NS is appropriate for mom at this time. It will be difficult for mom to apply. Baby latches onto NS and BF well. Noted a few dots of colostrum in NS. Breast is soft at this time making using NS difficult. Shells given to mom strongly encouraged to wear. Mom shown how to use DEBP & how to disassemble, clean, & reassemble parts. Mom knows to pump q3h for 15-20 min. Mom encouraged to feed baby 8-12 times/24 hours and with feeding cues.   FOB asking for formula. Mom stated she wanted to wait. Newborn feeding habits, STS, I&O, support, positioning, supply and demand reviewed. Lactation brochure given. Encouraged to make follow up appt. And call for [facility] assistance today.  Patient Name: Heather Lloyd OHYWV'P Date: 12/26/2021 Reason for consult: Initial assessment;Term;Primapara Age:64 hours  Maternal Data Has patient been taught Hand Expression?: Yes Does the patient have breastfeeding experience prior to this delivery?: No  Feeding    LATCH Score Latch: Repeated attempts needed to sustain latch, nipple held in mouth throughout feeding, stimulation needed to elicit sucking reflex.  Audible Swallowing: None  Type of Nipple: Flat  Comfort (Breast/Nipple): Soft / non-tender  Hold (Positioning): Full assist, staff holds infant at breast  LATCH Score: 4   Lactation Tools Discussed/Used    Interventions Interventions: Breast feeding basics reviewed;Assisted with latch;Skin to skin;Breast massage;Hand express;Pre-pump if needed;Breast compression;Adjust position;Support pillows;Position options;Shells;Hand pump;DEBP;LC Services brochure  Discharge    Consult  Status Consult Status: Follow-up Date: 12/26/21 Follow-up type: In-patient    Theodoro Kalata 12/26/2021, 5:37 AM

## 2021-12-26 NOTE — Lactation Note (Signed)
This note was copied from a baby's chart. Lactation Consultation Note RN called [place] stating I needed to bring hand pump and NS d/t mom was flat and unable to latch. LC brought these things but didn't use them. Mom has very compressible breast. Baby was able to maintain latch. The baby looks as if he sucks in then pushes nipple out in and out w/each suck.  Mom stated it is very strong. Baby lost hold and placed a bruise above nipple. Mom will need more assistance on MBU. Told mom LC would see her when she gets to floor and settled.  Patient Name: Heather Lloyd [id number] Date: 12/26/2021 Reason for consult: L&D Initial assessment;Primapara;Term Age:33 hours  Maternal Data    Feeding    LATCH Score Latch: Grasps breast easily, tongue down, lips flanged, rhythmical sucking.  Audible Swallowing: None  Type of Nipple: Flat  Comfort (Breast/Nipple): Soft / non-tender  Hold (Positioning): Full assist, staff holds infant at breast  LATCH Score: 5   Lactation Tools Discussed/Used    Interventions Interventions: Expressed milk;Hand express  Discharge    Consult Status Consult Status: Follow-up from L&D Date: 12/26/21 Follow-up type: In-patient    Maddelynn Moosman, [place] 12/26/2021, 3:10 AM

## 2021-12-26 NOTE — Lactation Note (Signed)
This note was copied from a baby's chart. Lactation Consultation Note  Patient Name: Heather Lloyd JASNK'N Date: 12/26/2021 Reason for consult: Follow-up assessment;1st time breastfeeding;Primapara;Difficult latch;Other (Comment) (2nd [employer] visit to review the DEBP and check the flange size. #24 F a good comfortable fit per mom. Baby woke up sucking on fingers and LC assisted mom to latch on the right breast while pumping Lt. Elysha Daw LC finsihed the consult for supplmenting) Age:39 hours  LC reviewed supplementation offering EBM first then formula. BF supplementation guide provided. Mom aware to offer more volume if infant not latching in midst of a feeding.   Mom to post pump with DEBP q 3hrs for 15 min .   Infant used yellow then moved to purple extra slow flow and took 15 ml of Similac.  All questions answered at the end of the visit.   Maternal Data Has patient been taught Hand Expression?: Yes (several small drops of colostrum expressed and baby mouthed them off the nipple)  Feeding Mother's Current Feeding Choice: Breast Milk  LATCH Score Latch: Grasps breast easily, tongue down, lips flanged, rhythmical sucking.  Audible Swallowing: None  Type of Nipple: Everted at rest and after stimulation  Comfort (Breast/Nipple): Soft / non-tender  Hold (Positioning): Assistance needed to correctly position infant at breast and maintain latch.  LATCH Score: 7   Lactation Tools Discussed/Used Tools: Flanges;Pump;Nipple Francine Graven Size: 24 Breast pump type: Double-Electric Breast Pump;Manual Pump Education: Setup, frequency, and cleaning Reason for Pumping: due to Difficult latch and poor latch Pumped volume: 0 mL  Interventions Interventions: Breast feeding basics reviewed;Assisted with latch;Skin to skin;Adjust position;Support pillows;Position options;Shells;DEBP;Hand pump;Education  Discharge    Consult Status Consult Status: Follow-up Date: 12/26/21 Follow-up  type: In-patient    Heather Lloyd  [name] 12/26/2021, 4:15 PM

## 2021-12-27 NOTE — Progress Notes (Signed)
Postpartum Note Day #1  S:  Patient doing well.  Pain controlled.  Tolerating regular diet.  Ambulating and voiding without difficulty. Working on breastfeeding and has had to supplement with formula. Has Lovenox at home. States she is unable to use Motrin. Denies fevers, chills, chest pain, SOB, N/V, or worsening bilateral LE edema.  Lochia: Minimal Infant feeding:  Breast Circumcision:  Desires prior to discharge Contraception:  None at this time  O: Temp:  [97.9 F (36.6 C)-98.1 F (36.7 C)] 97.9 F (36.6 C) (02/15 0539) Pulse Rate:  [84-95] 88 (02/15 0539) Resp:  [16-18] 18 (02/15 0539) BP: (113-130)/(66-79) 113/66 (02/15 0539) SpO2:  [96 %-100 %] 100 % (02/15 0539) Gen: NAD, pleasant and cooperative Resp: No increased work of breathing Abdomen: soft, non-distended, non-tender throughout Uterus: firm, non-tender, below umbilicus Ext: No bilateral LE edema, no bilateral calf tenderness  Labs:  Recent Labs    12/25/21 0057 12/26/21 0722  HGB 11.6* 11.2*    A/P: Patient is a 31 y.o. G1P1001 PPD#1 s/p SVD.  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 Marin Olp) - Vascular surgeon: [facility]  - Medication: Lovenox 120mg  BID (ordered)   Disposition:  D/C home PPD#2   Drema [place], DO [phone] (office)

## 2021-12-27 NOTE — Lactation Note (Signed)
This note was copied from a baby's chart. Lactation Consultation Note  Patient Name: Heather Lloyd WNUUV'O Date: 12/27/2021 Reason for consult: Follow-up assessment;Difficult latch;Primapara;1st time breastfeeding;Term;Infant weight loss Age:30 hours Mom ready to post pump when LC entered the room. LC assisted mom and checked the #24 F,both appeared to be good fits and per mom comfortable.  Mom pumped for 15 mins with  6 ml EBM yield . Mom expressed excitement over finally getting colostrum. [place] reassured mom there will be more with consistent feeding and pumping.  After pumping LC noted a positional strip on the left inner aspect of the nipple .  LC encouraged mom to call for the next feeding for assessment with the use of the NS and recommended increasing to a #27 F on the left breast to prevent further damage of the nipple.    Maternal Data    Feeding Mother's Current Feeding Choice: Breast Milk and Formula  LATCH Score ( Latch score from [facility] ) after feeding baby was supplemented with 35 ml of formula per mom.  Latch: Repeated attempts needed to sustain latch, nipple held in mouth throughout feeding, stimulation needed to elicit sucking reflex.  Audible Swallowing: Spontaneous and intermittent (with formula given with syringe)  Type of Nipple: Flat  Comfort (Breast/Nipple): Soft / non-tender  Hold (Positioning): Assistance needed to correctly position infant at breast and maintain latch.  LATCH Score: 7   Lactation Tools Discussed/Used Tools: Pump Nipple shield size: 24 Flange Size: 24 Breast pump type: Double-Electric Breast Pump Pump Education: Milk Storage (already set up) Reason for Pumping: DL , NS Pumping frequency: enc after every feeding Pumped volume: 6 mL  Interventions Interventions: Breast feeding basics reviewed;Education  Discharge    Consult Status Consult Status: Follow-up Date: 12/27/21 Follow-up type: In-patient    [place] 12/27/2021, 12:11 PM

## 2021-12-27 NOTE — Lactation Note (Addendum)
This note was copied from a baby's chart. Lactation Consultation Note  Patient Name: Heather Lloyd NTIRW'E Date: 12/27/2021 Reason for consult: Follow-up assessment;Mother's request;Term;Breastfeeding assistance Age:30 hours  LC assisted with latching infant in football position without the NS.  LC reviewed with Mom feeding by cues 8-12 24hr period.   Mom increased flange size to 27 on both breasts and states it is a better fit.   Mom to offer breast with compression noting signs of milk transfer.  Mom to supplement with EBM first followed by formula with pace bottle feeding and extra slow flow nipple. BF supplementation guide provided.  DEBP q 3hrs for 15 min   All questions answered at the end of the visit.   Maternal Data    Feeding Mother's Current Feeding Choice: Breast Milk and Formula  LATCH Score Latch: Repeated attempts needed to sustain latch, nipple held in mouth throughout feeding, stimulation needed to elicit sucking reflex.  Audible Swallowing: Spontaneous and intermittent  Type of Nipple: Flat (but will evert with nipple role.)  Comfort (Breast/Nipple): Soft / non-tender  Hold (Positioning): Assistance needed to correctly position infant at breast and maintain latch.  LATCH Score: 7   Lactation Tools Discussed/Used Tools: Pump;Flanges Flange Size: 27 Breast pump type: Double-Electric Breast Pump Pump Education: Setup, frequency, and cleaning;Milk Storage Reason for Pumping: increase stimulation Pumping frequency: every 3 hrs for 15 min  Interventions Interventions: Breast feeding basics reviewed;Assisted with latch;Skin to skin;Breast massage;Hand express;Breast compression;Position options;Adjust position;Support pillows;Expressed milk;DEBP;Education;Pace feeding;Tour manager education  Discharge    Consult Status Consult Status: Follow-up Date: 12/28/21 Follow-up type: In-patient    Heather Lloyd   [name] 12/27/2021, 6:00 PM

## 2021-12-28 NOTE — Discharge Summary (Signed)
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 yo G1P1001 at [redacted]w[redacted]d was admitted to the hospital 12/25/2021 for induction of labor.  Indication for induction:  History of DVT/PE (due to Factor V Leiden and Antithrombin III Deficiency) .  Patient had an uncomplicated labor course as follows: °Membrane Rupture Time/Date: 5:35 PM ,12/25/2021   °Delivery Method:Vaginal, Spontaneous  °Episiotomy: None  °Lacerations:  2nd degree;Perineal  °Details of delivery can be found in separate delivery note.  Patient had a routine postpartum course. Patient is discharged home 12/28/21. ° °Newborn Data: °Birth date:12/26/2021  °Birth time:1:48 AM  °Gender:Female  °Living status:Living  °Apgars:8 ,9  °Weight:3785 g  ° °Magnesium Sulfate received: No °BMZ received: No °Rhophylac:N/A °MMR:N/A °T-DaP:Given prenatally °Flu: No °Transfusion:No ° °Physical exam  °Vitals:  ° 12/27/21 0539 12/27/21 1445 12/27/21 2053 12/28/21 0535  °BP: 113/66 115/66 (!) 101/46 113/72   °Pulse: 88 (!) 105 100 (!) 104  °Resp: 18 19 16 17  °Temp: 97.9 °F (36.6 °C) 99 °F (37.2 °C) 98.2 °F (36.8 °C) 97.7 °F (36.5 °C)  °TempSrc: Oral Oral Oral Oral  °SpO2: 100% 99% 99% 98%  °Weight:      °Height:      ° °General: alert, cooperative, and no distress °Pulm: No increased work of breathing °Lochia: appropriate °Uterine Fundus: firm °Incision: N/A °DVT Evaluation: No evidence of DVT seen on physical exam. °No cords or calf tenderness. °Pelvic: Well-approximated second degree laceration repair, no erythema ° °Labs: °Lab Results  °Component Value Date  ° WBC 22.6 (H) 12/26/2021  ° HGB 11.2 (L) 12/26/2021  ° HCT 33.5 (L) 12/26/2021  ° MCV 82.7 12/26/2021  ° PLT 170 12/26/2021  ° °CMP Latest Ref Rng & Units 11/27/2021  °Glucose 70 - 99 mg/dL 89  °BUN 6 - 20 mg/dL 7  °Creatinine 0.44 - 1.00 mg/dL 0.77  °Sodium 135 - 145 mmol/L 136  °Potassium 3.5 - 5.1 mmol/L 4.3  °Chloride 98 - 111 mmol/L 105  °CO2 22 - 32 mmol/L 21(L)  °Calcium 8.9 - 10.3 mg/dL 9.8  °Total Protein 6.5 - 8.1 g/dL 7.0  °Total Bilirubin 0.3 - 1.2 mg/dL 0.3  °Alkaline Phos 38 - 126 U/L 81  °AST 15 - 41 U/L 10(L)  °ALT 0 - 44 U/L 13  ° °Edinburgh Score: °Edinburgh Postnatal Depression Scale Screening Tool 12/26/2021  °I have been able to laugh and see the funny   side of things. 0  °I have looked forward with enjoyment to things. 0  °I have blamed myself unnecessarily when things went wrong. 1  °I have been anxious or worried for no good reason. 0  °I have felt scared or panicky for no good reason. 0  °Things have been getting on top of me. 1  °I have been so unhappy that I have had difficulty sleeping. 0  °I have felt sad or miserable. 0  °I have been so unhappy that I have been crying. 0  °The thought of harming myself has occurred to me. 0  °Edinburgh Postnatal Depression Scale Total 2  ° ° ° ° °After visit meds:  °Allergies as of 12/28/2021   ° °   Reactions  ° Codeine Hives, Swelling  ° Ibuprofen Other (See Comments)  ° Pt is on blood thinners  ° °   ° °  °Medication List  °  ° °STOP taking these medications   ° °doxylamine (Sleep) 25 MG tablet °Commonly known as: UNISOM °  °heparin 10000 UNIT/ML injection °  ° °  ° °TAKE these medications   ° °enoxaparin 120 MG/0.8ML injection °Commonly known as: LOVENOX °Inject 0.8 mLs (120 mg total) into the skin every 12 (twelve) hours. °  °polyethylene glycol 17 g packet °Commonly known as: MIRALAX / GLYCOLAX °Take 17 g by mouth daily as needed. °  °prenatal multivitamin Tabs tablet °Take 1 tablet by mouth daily at 12 noon. °  ° °  ° ° ° °Discharge home in stable condition °Infant Feeding: Breast °Infant Disposition:home with mother °Discharge instruction: per After Visit Summary and Postpartum booklet. °Activity: Advance as tolerated. Pelvic rest for 6 weeks.  °Diet: routine diet °Anticipated Birth Control:  None at this time °Postpartum Appointment:6 weeks °Additional Postpartum F/U:  None °Future Appointments: °Future Appointments  °Date Time Provider Department Center  °01/15/2022 12:00 PM [facility] None  °01/15/2022 12:15 PM Ennever, Peter R, MD [facility] None  ° °Follow up Visit: ° Follow-up Information   ° ° Davies, Melissa, DO Follow up in 6 week(s).   °Specialty: Obstetrics and Gynecology °Why: Please keep your previously scheduled 6 week postpartum visit. °Contact information: °[address] °[address] °[place] [address] °[phone] ° ° °  °  ° °  °  ° °  ° ° ° °  ° °12/28/2021 °Melissa Davies, DO ° ° °

## 2021-12-28 NOTE — Lactation Note (Signed)
This note was copied from a baby's chart. Lactation Consultation Note  Patient Name: Heather Lloyd YIRSW'N Date: 12/28/2021 Reason for consult: Follow-up assessment Age:30 hours  P1, Baby has improved with latching but recommend mother have follow up with outpatient LC at her Peds office. Observed feeding helping mother obtain and sustain a deep latch.  Mother's breasts are filling.  Mother pumped 30 ml.  Suggest continuing to post pump and give volume back to baby. Reviewed engorgement care and monitoring voids/stools.   Maternal Data Has patient been taught Hand Expression?: Yes  Feeding Mother's Current Feeding Choice: Breast Milk and Formula  LATCH Score Latch: Repeated attempts needed to sustain latch, nipple held in mouth throughout feeding, stimulation needed to elicit sucking reflex.  Audible Swallowing: A few with stimulation  Type of Nipple: Flat  Comfort (Breast/Nipple): Soft / non-tender  Hold (Positioning): Assistance needed to correctly position infant at breast and maintain latch.  LATCH Score: 6   Lactation Tools Discussed/Used    Interventions Interventions: Skin to skin;Assisted with latch;Breast feeding basics reviewed;Hand express;Breast massage;Support pillows;Breast compression;DEBP;Education  Discharge Discharge Education: Engorgement and breast care;Warning signs for feeding baby;Outpatient recommendation Pump: Personal;DEBP (motif)  Consult Status Consult Status: Complete Date: 12/28/21    Vivianne Master [facility] 12/28/2021, 10:54 AM

## 2022-01-05 ENCOUNTER — Telehealth (HOSPITAL_COMMUNITY): Payer: Self-pay | Admitting: *Deleted

## 2022-01-05 NOTE — Telephone Encounter (Signed)
Phone voicemail message left to return nurse call.  Odis Hollingshead, RN 01-05-2022 at 10:33am

## 2022-01-13 ENCOUNTER — Other Ambulatory Visit: Payer: Self-pay | Admitting: Hematology & Oncology

## 2022-01-13 DIAGNOSIS — D6851 Activated protein C resistance: Secondary | ICD-10-CM

## 2022-01-15 ENCOUNTER — Encounter: Payer: Self-pay | Admitting: Hematology & Oncology

## 2022-01-15 ENCOUNTER — Other Ambulatory Visit: Payer: Self-pay

## 2022-01-15 ENCOUNTER — Inpatient Hospital Stay: Payer: 59 | Attending: Hematology & Oncology

## 2022-01-15 ENCOUNTER — Inpatient Hospital Stay: Payer: 59 | Admitting: Hematology & Oncology

## 2022-01-15 VITALS — BP 103/52 | HR 73 | Temp 98.2°F | Resp 17 | Wt 258.1 lb

## 2022-01-15 DIAGNOSIS — D6851 Activated protein C resistance: Secondary | ICD-10-CM

## 2022-01-15 DIAGNOSIS — I871 Compression of vein: Secondary | ICD-10-CM | POA: Insufficient documentation

## 2022-01-15 DIAGNOSIS — Z86718 Personal history of other venous thrombosis and embolism: Secondary | ICD-10-CM | POA: Diagnosis not present

## 2022-01-15 DIAGNOSIS — Z86711 Personal history of pulmonary embolism: Secondary | ICD-10-CM | POA: Insufficient documentation

## 2022-01-15 LAB — CMP (CANCER CENTER ONLY)
ALT: 26 U/L (ref 0–44)
AST: 16 U/L (ref 15–41)
Albumin: 3.7 g/dL (ref 3.5–5.0)
Alkaline Phosphatase: 74 U/L (ref 38–126)
Anion gap: 10 (ref 5–15)
BUN: 15 mg/dL (ref 6–20)
CO2: 23 mmol/L (ref 22–32)
Calcium: 8.9 mg/dL (ref 8.9–10.3)
Chloride: 107 mmol/L (ref 98–111)
Creatinine: 1.01 mg/dL — ABNORMAL HIGH (ref 0.44–1.00)
GFR, Estimated: >60 mL/min (ref >60)
Glucose, Bld: 93 mg/dL (ref 70–99)
Potassium: 4 mmol/L (ref 3.5–5.1)
Sodium: 140 mmol/L (ref 135–145)
Total Bilirubin: 0.3 mg/dL (ref 0.3–1.2)
Total Protein: 6.8 g/dL (ref 6.5–8.1)

## 2022-01-15 LAB — CBC WITH DIFFERENTIAL (CANCER CENTER ONLY)
Abs Immature Granulocytes: 0.06 10*3/uL (ref 0.00–0.07)
Basophils Absolute: 0 10*3/uL (ref 0.0–0.1)
Basophils Relative: 1 %
Eosinophils Absolute: 0.2 10*3/uL (ref 0.0–0.5)
Eosinophils Relative: 2 %
HCT: 37.9 % (ref 36.0–46.0)
Hemoglobin: 12.4 g/dL (ref 12.0–15.0)
Immature Granulocytes: 1 %
Lymphocytes Relative: 32 %
Lymphs Abs: 2.3 10*3/uL (ref 0.7–4.0)
MCH: 26.8 pg (ref 26.0–34.0)
MCHC: 32.7 g/dL (ref 30.0–36.0)
MCV: 82 fL (ref 80.0–100.0)
Monocytes Absolute: 0.5 10*3/uL (ref 0.1–1.0)
Monocytes Relative: 6 %
Neutro Abs: 4.1 10*3/uL (ref 1.7–7.7)
Neutrophils Relative %: 58 %
Platelet Count: 271 10*3/uL (ref 150–400)
RBC: 4.62 MIL/uL (ref 3.87–5.11)
RDW: 14.3 % (ref 11.5–15.5)
WBC Count: 7.1 10*3/uL (ref 4.0–10.5)
nRBC: 0 % (ref 0.0–0.2)

## 2022-01-15 NOTE — Progress Notes (Signed)
?Hematology and Oncology Follow Up Visit ? ?Heather Lloyd ?[id number] ?[birth year] 31 y.o. ?01/15/2022 ? ? 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  ?Current Therapy:        ?Lovenox 120 mg SQ BID - pregnant, due date 12/29/2021 ? ?  ?Interim History:  Heather Lloyd is here today for follow-up.  She is now a new mom.  She had her baby boy on Valentine's Day.  His name is Heather Lloyd.  He weighed 8 pounds 5 ounces.  No she was in labor for about 24 hours. ? ?There is no problems with bleeding.  She was off Lovenox and on heparin.  She now is back on Lovenox. ? ?She is breast-feeding.  She has not, for some reason, is not producing a lot of breastmilk right now.  Not sure as to why.  She will see her obstetrician about this.  I am sure that she will see a neuro specialist who deals with breast-feeding issues. ? ?She is having some pain in the legs.  This seems to be doing little bit better right now. ? ?She has had no chest wall pain.  There is been no shortness of breath.  She has had no nausea or vomiting.  She has had no change in bowel or bladder habits. ? ?Overall, I would have said that her performance status is probably ECOG 1.  ? ? ?Medications:  ?Allergies as of 01/15/2022   ? ?   Reactions  ? Codeine Hives, Swelling  ? Ibuprofen Other (See Comments)  ? Pt is on blood thinners  ? ?  ? ?  ?Medication List  ?  ? ?  ? Accurate as of January 15, 2022 12:33 PM. If you have any questions, ask your nurse or doctor.  ?  ?  ? ?  ? ?enoxaparin 120 MG/0.8ML injection ?Commonly known as: LOVENOX ?INJECT '120MG'$  (1 SYRINGE)  SUBCUTANEOUSLY EVERY 12 HOURS ?  ?polyethylene glycol 17 g packet ?Commonly known as: MIRALAX / GLYCOLAX ?Take 17 g by mouth daily as needed. ?  ?prenatal multivitamin Tabs tablet ?Take 1 tablet by mouth daily at 12 noon. ?  ? ?  ? ? ?Allergies:  ?Allergies  ?Allergen Reactions  ?  Codeine Hives and Swelling  ? Ibuprofen Other (See Comments)  ?  Pt is on blood thinners ?  ? ? ?Past Medical History, Surgical history, Social history, and Family History were reviewed and updated. ? ?Review of Systems: ?Review of Systems  ?Constitutional: Negative.   ?HENT: Negative.    ?Eyes: Negative.   ?Respiratory: Negative.    ?Cardiovascular: Negative.   ?Gastrointestinal: Negative.   ?Genitourinary: Negative.   ?Musculoskeletal: Negative.   ?Skin: Negative.   ?Neurological: Negative.   ?Endo/Heme/Allergies: Negative.   ?Psychiatric/Behavioral: Negative.    ? ? ?Physical Exam: ? weight is 258 lb 1.9 oz (117.1 kg). Her oral temperature is 98.2 ?F (36.8 ?C). Her blood pressure is 103/52 (abnormal) and her pulse is 73. Her respiration is 17 and oxygen saturation is 99%.  ? ?Wt Readings from Last 3 Encounters:  ?01/15/22 258 lb 1.9 oz (117.1 kg)  ?12/25/21 282 lb 3 oz (128 kg)  ?11/27/21 278 lb (126.1 kg)  ? ? ?Physical Exam ?Vitals reviewed.  ?HENT:  ?   Head: Normocephalic and atraumatic.  ?Eyes:  ?   Pupils: Pupils are equal, round, and reactive to light.  ?Cardiovascular:  ?  Rate and Rhythm: Normal rate and regular rhythm.  ?   Heart sounds: Normal heart sounds.  ?Pulmonary:  ?   Effort: Pulmonary effort is normal.  ?   Breath sounds: Normal breath sounds.  ?Abdominal:  ?   General: Bowel sounds are normal.  ?   Comments: Abdominal exam shows a pregnant abdomen.  She has decent bowel sounds.  She has couple areas of ecchymoses where she injected her Lovenox/heparin.  There is no fluid wave.  There is no palpable liver or spleen tip.    ?Musculoskeletal:     ?   General: No tenderness or deformity. Normal range of motion.  ?   Cervical back: Normal range of motion.  ?   Comments: Extremity shows no clubbing, cyanosis or edema.  She has very little swelling in the lower legs.  She has good strength.  She has good range of motion.  ?Lymphadenopathy:  ?   Cervical: No cervical adenopathy.  ?Skin: ?   General:  Skin is warm and dry.  ?   Findings: No erythema or rash.  ?Neurological:  ?   Mental Status: She is alert and oriented to person, place, and time.  ?Psychiatric:     ?   Behavior: Behavior normal.     ?   Thought Content: Thought content normal.     ?   Judgment: Judgment normal.  ? ? ?Lab Results  ?Component Value Date  ? WBC 7.1 01/15/2022  ? HGB 12.4 01/15/2022  ? HCT 37.9 01/15/2022  ? MCV 82.0 01/15/2022  ? PLT 271 01/15/2022  ? ?No results found for: FERRITIN, IRON, TIBC, UIBC, IRONPCTSAT ?Lab Results  ?Component Value Date  ? RETICCTPCT 2.24 (H) 04/25/2017  ? RBC 4.62 01/15/2022  ? RETICCTABS 105.73 (H) 04/25/2017  ? ?No results found for: KPAFRELGTCHN, LAMBDASER, KAPLAMBRATIO ?No results found for: IGGSERUM, IGA, IGMSERUM ?No results found for: TOTALPROTELP, ALBUMINELP, A1GS, A2GS, BETS, BETA2SER, GAMS, MSPIKE, SPEI ?  Chemistry   ?   ?Component Value Date/Time  ? NA 136 11/27/2021 1242  ? NA 140 04/25/2017 1550  ? K 4.3 11/27/2021 1242  ? K 3.8 04/25/2017 1550  ? CL 105 11/27/2021 1242  ? CO2 21 (L) 11/27/2021 1242  ? CO2 26 04/25/2017 1550  ? BUN 7 11/27/2021 1242  ? BUN 12.5 04/25/2017 1550  ? CREATININE 0.77 11/27/2021 1242  ? CREATININE 1.0 04/25/2017 1550  ?    ?Component Value Date/Time  ? CALCIUM 9.8 11/27/2021 1242  ? CALCIUM 9.6 04/25/2017 1550  ? ALKPHOS 81 11/27/2021 1242  ? ALKPHOS 60 04/25/2017 1550  ? AST 10 (L) 11/27/2021 1242  ? AST 23 04/25/2017 1550  ? ALT 13 11/27/2021 1242  ? ALT 28 04/25/2017 1550  ? BILITOT 0.3 11/27/2021 1242  ? BILITOT 0.48 04/25/2017 1550  ?  ? ? ? 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   ? ?She got through her pregnancy very nicely.  She gave birth to a very healthy baby boy.  There were no problems with bleeding.  There is no problem  with thromboembolic events. ? ?She is on Lovenox now.  She will continue on Lovenox until she stops breast-feeding.  When she stops breast-feeding, then she will go on to Eliquis. ? ?She is not sure about having another child.  It is certainly too early for her to think about this.  She just is trying to manage her first 1. ? ?I am just happy for her again.  She really did a great job.  She literally "took 1 for the team." ? ?I would like to get her back to see her in another couple months.  If she stops breast-feeding, then she will let us know.  ? ? ?Volanda Napoleon, MD ?3/6/202312:33 PM ? ?

## 2022-02-02 ENCOUNTER — Encounter: Payer: Self-pay | Admitting: Hematology & Oncology

## 2022-03-02 ENCOUNTER — Other Ambulatory Visit: Payer: Self-pay | Admitting: *Deleted

## 2022-03-02 ENCOUNTER — Telehealth: Payer: Self-pay | Admitting: *Deleted

## 2022-03-02 MED ORDER — APIXABAN 5 MG PO TABS: 5.0000 mg | ORAL_TABLET | Freq: Two times a day (BID) | ORAL | 12 refills | Status: DC

## 2022-03-02 NOTE — Telephone Encounter (Signed)
Message received from patient stating that she is beginning to wean her baby from breast feeding and would like to be switched back to Eliquis.  Dr. Marin Olp notified and prescription for Eliquis 5 mg BID sent to the [facility] on [address] per pt.'s request.  Pt notified of above and has no other questions at this time.  ?

## 2022-03-12 ENCOUNTER — Encounter: Payer: Self-pay | Admitting: Hematology & Oncology

## 2022-03-12 ENCOUNTER — Other Ambulatory Visit: Payer: Self-pay

## 2022-03-12 ENCOUNTER — Inpatient Hospital Stay: Payer: 59 | Attending: Hematology & Oncology

## 2022-03-12 ENCOUNTER — Inpatient Hospital Stay: Payer: 59 | Admitting: Hematology & Oncology

## 2022-03-12 VITALS — BP 113/72 | HR 70 | Temp 98.2°F | Resp 18 | Ht 69.0 in | Wt 267.8 lb

## 2022-03-12 DIAGNOSIS — Z86718 Personal history of other venous thrombosis and embolism: Secondary | ICD-10-CM | POA: Insufficient documentation

## 2022-03-12 DIAGNOSIS — D6851 Activated protein C resistance: Secondary | ICD-10-CM | POA: Insufficient documentation

## 2022-03-12 DIAGNOSIS — I871 Compression of vein: Secondary | ICD-10-CM | POA: Insufficient documentation

## 2022-03-12 DIAGNOSIS — Z7901 Long term (current) use of anticoagulants: Secondary | ICD-10-CM | POA: Diagnosis not present

## 2022-03-12 DIAGNOSIS — Z86711 Personal history of pulmonary embolism: Secondary | ICD-10-CM | POA: Diagnosis not present

## 2022-03-12 LAB — CMP (CANCER CENTER ONLY)
ALT: 25 U/L (ref 0–44)
AST: 14 U/L — ABNORMAL LOW (ref 15–41)
Albumin: 4.5 g/dL (ref 3.5–5.0)
Alkaline Phosphatase: 62 U/L (ref 38–126)
Anion gap: 11 (ref 5–15)
BUN: 18 mg/dL (ref 6–20)
CO2: 23 mmol/L (ref 22–32)
Calcium: 10 mg/dL (ref 8.9–10.3)
Chloride: 105 mmol/L (ref 98–111)
Creatinine: 0.96 mg/dL (ref 0.44–1.00)
GFR, Estimated: >60 mL/min (ref >60)
Glucose, Bld: 111 mg/dL — ABNORMAL HIGH (ref 70–99)
Potassium: 4.4 mmol/L (ref 3.5–5.1)
Sodium: 139 mmol/L (ref 135–145)
Total Bilirubin: 0.4 mg/dL (ref 0.3–1.2)
Total Protein: 7.9 g/dL (ref 6.5–8.1)

## 2022-03-12 LAB — CBC WITH DIFFERENTIAL (CANCER CENTER ONLY)
Abs Immature Granulocytes: 0.1 10*3/uL — ABNORMAL HIGH (ref 0.00–0.07)
Basophils Absolute: 0.1 10*3/uL (ref 0.0–0.1)
Basophils Relative: 1 %
Eosinophils Absolute: 0.3 10*3/uL (ref 0.0–0.5)
Eosinophils Relative: 3 %
HCT: 40.1 % (ref 36.0–46.0)
Hemoglobin: 13.2 g/dL (ref 12.0–15.0)
Immature Granulocytes: 1 %
Lymphocytes Relative: 30 %
Lymphs Abs: 2.7 10*3/uL (ref 0.7–4.0)
MCH: 26.8 pg (ref 26.0–34.0)
MCHC: 32.9 g/dL (ref 30.0–36.0)
MCV: 81.5 fL (ref 80.0–100.0)
Monocytes Absolute: 0.5 10*3/uL (ref 0.1–1.0)
Monocytes Relative: 6 %
Neutro Abs: 5.4 10*3/uL (ref 1.7–7.7)
Neutrophils Relative %: 59 %
Platelet Count: 291 10*3/uL (ref 150–400)
RBC: 4.92 MIL/uL (ref 3.87–5.11)
RDW: 14.7 % (ref 11.5–15.5)
WBC Count: 9 10*3/uL (ref 4.0–10.5)
nRBC: 0 % (ref 0.0–0.2)

## 2022-03-12 LAB — D-DIMER, QUANTITATIVE: D-Dimer, Quant: 0.41 ug/mL-FEU (ref 0.00–0.50)

## 2022-03-12 LAB — LACTATE DEHYDROGENASE: LDH: 138 U/L (ref 98–192)

## 2022-03-12 NOTE — Progress Notes (Signed)
?Hematology and Oncology Follow Up Visit ? ?Heather Lloyd ?[id number] ?02/01/1991 31 y.o. ?03/12/2022 ? ? 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  ?Current Therapy:        ?Lovenox 120 mg SQ BID - pregnant, due date 12/29/2021 ?Eliquis 5 mg po BID -- started on 02/26/2022 ? ?  ?Interim History:  Heather Lloyd is here today for follow-up.  Her baby boy is almost 23 months old.  He is growing.  He is now 14 pounds. ? ?She is back on Eliquis.  She is just having a tough time with the Lovenox.  She is having some abdominal discomfort.  She has seen her gynecologist about this. ? ?Since starting the Eliquis, she been having a few headaches.  There is no visual changes.  She did get dizzy at one point.  There is nothing that she is taking for this. ? ?She has had no change in bowel or bladder habits.  She has little bit of tightness in the left leg which is chronic. ? ?She has had no cough or shortness of breath.  There is no chest wall pain. ? ?She has go back to work next Monday.  She really is not looking forward to this.  I can understand why. ? ?Overall, I would say her performance status is probably ECOG 0. ? ? ?Medications:  ?Allergies as of 03/12/2022   ? ?   Reactions  ? Codeine Hives, Swelling  ? Ibuprofen Other (See Comments)  ? Pt is on blood thinners  ? ?  ? ?  ?Medication List  ?  ? ?  ? Accurate as of Mar 12, 2022  1:57 PM. If you have any questions, ask your nurse or doctor.  ?  ?  ? ?  ? ?STOP taking these medications   ? ?enoxaparin 120 MG/0.8ML injection ?Commonly known as: LOVENOX ?Stopped by: Volanda Napoleon, MD ?  ? ?  ? ?TAKE these medications   ? ?apixaban 5 MG Tabs tablet ?Commonly known as: Eliquis ?Take 1 tablet (5 mg total) by mouth 2 (two) times daily. ?  ?polyethylene glycol 17 g packet ?Commonly known as: MIRALAX / GLYCOLAX ?Take 17 g by mouth daily as  needed. ?  ?prenatal multivitamin Tabs tablet ?Take 1 tablet by mouth daily at 12 noon. ?  ? ?  ? ? ?Allergies:  ?Allergies  ?Allergen Reactions  ? Codeine Hives and Swelling  ? Ibuprofen Other (See Comments)  ?  Pt is on blood thinners ?  ? ? ?Past Medical History, Surgical history, Social history, and Family History were reviewed and updated. ? ?Review of Systems: ?Review of Systems  ?Constitutional: Negative.   ?HENT: Negative.    ?Eyes: Negative.   ?Respiratory: Negative.    ?Cardiovascular: Negative.   ?Gastrointestinal: Negative.   ?Genitourinary: Negative.   ?Musculoskeletal: Negative.   ?Skin: Negative.   ?Neurological: Negative.   ?Endo/Heme/Allergies: Negative.   ?Psychiatric/Behavioral: Negative.    ? ? ?Physical Exam: ? height is '5\' 9"'$  (1.753 m) and weight is 267 lb 12.8 oz (121.5 kg). Her oral temperature is 98.2 ?F (36.8 ?C). Her blood pressure is 113/72 and her pulse is 70. Her respiration is 18 and oxygen saturation is 100%.  ? ?Wt Readings from Last 3 Encounters:  ?03/12/22 267 lb 12.8 oz (121.5 kg)  ?01/15/22 258 lb 1.9 oz (117.1 kg)  ?12/25/21 282 lb 3 oz (  128 kg)  ? ? ?Physical Exam ?Vitals reviewed.  ?HENT:  ?   Head: Normocephalic and atraumatic.  ?Eyes:  ?   Pupils: Pupils are equal, round, and reactive to light.  ?Cardiovascular:  ?   Rate and Rhythm: Normal rate and regular rhythm.  ?   Heart sounds: Normal heart sounds.  ?Pulmonary:  ?   Effort: Pulmonary effort is normal.  ?   Breath sounds: Normal breath sounds.  ?Abdominal:  ?   General: Bowel sounds are normal.  ?   Comments: Abdominal exam shows a pregnant abdomen.  She has decent bowel sounds.  She has couple areas of ecchymoses where she injected her Lovenox/heparin.  There is no fluid wave.  There is no palpable liver or spleen tip.    ?Musculoskeletal:     ?   General: No tenderness or deformity. Normal range of motion.  ?   Cervical back: Normal range of motion.  ?   Comments: Extremity shows no clubbing, cyanosis or edema.  She  has very little swelling in the lower legs.  She has good strength.  She has good range of motion.  ?Lymphadenopathy:  ?   Cervical: No cervical adenopathy.  ?Skin: ?   General: Skin is warm and dry.  ?   Findings: No erythema or rash.  ?Neurological:  ?   Mental Status: She is alert and oriented to person, place, and time.  ?Psychiatric:     ?   Behavior: Behavior normal.     ?   Thought Content: Thought content normal.     ?   Judgment: Judgment normal.  ? ? ?Lab Results  ?Component Value Date  ? WBC 9.0 03/12/2022  ? HGB 13.2 03/12/2022  ? HCT 40.1 03/12/2022  ? MCV 81.5 03/12/2022  ? PLT 291 03/12/2022  ? ?No results found for: FERRITIN, IRON, TIBC, UIBC, IRONPCTSAT ?Lab Results  ?Component Value Date  ? RETICCTPCT 2.24 (H) 04/25/2017  ? RBC 4.92 03/12/2022  ? RETICCTABS 105.73 (H) 04/25/2017  ? ?No results found for: KPAFRELGTCHN, LAMBDASER, KAPLAMBRATIO ?No results found for: IGGSERUM, IGA, IGMSERUM ?No results found for: TOTALPROTELP, ALBUMINELP, A1GS, A2GS, BETS, BETA2SER, GAMS, MSPIKE, SPEI ?  Chemistry   ?   ?Component Value Date/Time  ? NA 140 01/15/2022 1206  ? NA 140 04/25/2017 1550  ? K 4.0 01/15/2022 1206  ? K 3.8 04/25/2017 1550  ? CL 107 01/15/2022 1206  ? CO2 23 01/15/2022 1206  ? CO2 26 04/25/2017 1550  ? BUN 15 01/15/2022 1206  ? BUN 12.5 04/25/2017 1550  ? CREATININE 1.01 (H) 01/15/2022 1206  ? CREATININE 1.0 04/25/2017 1550  ?    ?Component Value Date/Time  ? CALCIUM 8.9 01/15/2022 1206  ? CALCIUM 9.6 04/25/2017 1550  ? ALKPHOS 74 01/15/2022 1206  ? ALKPHOS 60 04/25/2017 1550  ? AST 16 01/15/2022 1206  ? AST 23 04/25/2017 1550  ? ALT 26 01/15/2022 1206  ? ALT 28 04/25/2017 1550  ? BILITOT 0.3 01/15/2022 1206  ? BILITOT 0.48 04/25/2017 1550  ?  ? ? ? ?Impression and Plan: Heather Lloyd is a very pleasant 31 yo caucasian female with history of left lower extremity DVT and bilateral pulmonary emboli, factor V Leiden mutation-heterozygous, antithrombin III deficiency and May-Thurner syndrome with post  phlebitic syndrome.   ? ?Again, she got through pregnancy.  She did very well.  She had a very healthy baby boy.  He is growing up nicely.  He will be 3 months  old in a couple weeks. ? ?She is back on Eliquis.  She never has had a problem with the Eliquis. ? ?I think we can try to get her through the Summer now.  If there are any issues, we can certainly get her back sooner. ? ? ?Volanda Napoleon, MD ?5/1/20231:57 PM ? ?

## 2022-06-15 ENCOUNTER — Emergency Department (HOSPITAL_COMMUNITY): Payer: 59

## 2022-06-15 ENCOUNTER — Emergency Department (HOSPITAL_BASED_OUTPATIENT_CLINIC_OR_DEPARTMENT_OTHER)
Admission: EM | Admit: 2022-06-15 | Discharge: 2022-06-15 | Disposition: A | Discharge status: Home / Self Care | Payer: 59 | Attending: Emergency Medicine | Admitting: Emergency Medicine

## 2022-06-15 ENCOUNTER — Other Ambulatory Visit: Payer: Self-pay

## 2022-06-15 DIAGNOSIS — R1032 Left lower quadrant pain: Secondary | ICD-10-CM | POA: Diagnosis present

## 2022-06-15 DIAGNOSIS — N83202 Unspecified ovarian cyst, left side: Secondary | ICD-10-CM | POA: Insufficient documentation

## 2022-06-15 DIAGNOSIS — Z7901 Long term (current) use of anticoagulants: Secondary | ICD-10-CM | POA: Diagnosis not present

## 2022-06-15 LAB — URINALYSIS, ROUTINE W REFLEX MICROSCOPIC
Bilirubin Urine: NEGATIVE
Glucose, UA: NEGATIVE mg/dL
Hgb urine dipstick: NEGATIVE
Ketones, ur: NEGATIVE mg/dL
Leukocytes,Ua: NEGATIVE
Nitrite: NEGATIVE
Protein, ur: NEGATIVE mg/dL
Specific Gravity, Urine: 1.009 (ref 1.005–1.030)
pH: 5.5 (ref 5.0–8.0)

## 2022-06-15 LAB — COMPREHENSIVE METABOLIC PANEL
ALT: 31 U/L (ref 0–44)
AST: 22 U/L (ref 15–41)
Albumin: 4.5 g/dL (ref 3.5–5.0)
Alkaline Phosphatase: 54 U/L (ref 38–126)
Anion gap: 13 (ref 5–15)
BUN: 10 mg/dL (ref 6–20)
CO2: 21 mmol/L — ABNORMAL LOW (ref 22–32)
Calcium: 9.1 mg/dL (ref 8.9–10.3)
Chloride: 106 mmol/L (ref 98–111)
Creatinine, Ser: 0.8 mg/dL (ref 0.44–1.00)
GFR, Estimated: >60 mL/min (ref >60)
Glucose, Bld: 92 mg/dL (ref 70–99)
Potassium: 3.8 mmol/L (ref 3.5–5.1)
Sodium: 140 mmol/L (ref 135–145)
Total Bilirubin: 0.5 mg/dL (ref 0.3–1.2)
Total Protein: 7.7 g/dL (ref 6.5–8.1)

## 2022-06-15 LAB — CBC
HCT: 38.6 % (ref 36.0–46.0)
Hemoglobin: 12.6 g/dL (ref 12.0–15.0)
MCH: 27 pg (ref 26.0–34.0)
MCHC: 32.6 g/dL (ref 30.0–36.0)
MCV: 82.7 fL (ref 80.0–100.0)
Platelets: 268 10*3/uL (ref 150–400)
RBC: 4.67 MIL/uL (ref 3.87–5.11)
RDW: 14.6 % (ref 11.5–15.5)
WBC: 10.1 10*3/uL (ref 4.0–10.5)
nRBC: 0 % (ref 0.0–0.2)

## 2022-06-15 LAB — PREGNANCY, URINE: Preg Test, Ur: NEGATIVE

## 2022-06-15 LAB — LIPASE, BLOOD: Lipase: 19 U/L (ref 11–51)

## 2022-06-15 MED ORDER — OXYCODONE-ACETAMINOPHEN 5-325 MG PO TABS
1.0000 | ORAL_TABLET | Freq: Once | ORAL | Status: DC
  Filled 2022-06-15: qty 1

## 2022-06-15 MED ORDER — ACETAMINOPHEN 500 MG PO TABS: 1000.0000 mg | ORAL_TABLET | Freq: Once | ORAL | Status: DC

## 2022-06-15 MED ORDER — OXYCODONE HCL 5 MG PO TABS
5.0000 mg | ORAL_TABLET | Freq: Once | ORAL | Status: AC
  Administered 2022-06-15: 5 mg via ORAL
  Filled 2022-06-15: qty 1

## 2022-06-15 NOTE — ED Provider Triage Note (Signed)
Emergency Medicine Provider Triage Evaluation Note  Heather Lloyd , a 31 y.o. female  was evaluated in triage.  Pt complains of left lower quadrant abdominal pain. Sent here from [facility] ED as a transfer for rule out ovarian torsion. Pain began last night and has waxed/waned but becomes very severe and localized in the LLQ. Has history of ovarian cysts but feels different. Denies N/V/D or fevers. .  Review of Systems  Positive:  Negative:   Physical Exam  BP 121/76   Pulse 86   Temp 98.3 F (36.8 C) (Oral)   Resp 18   Ht '5\' 10"'$  (1.778 m)   Wt 117.9 kg   SpO2 100%   BMI 37.31 kg/m  Gen:   Awake, no distress   Resp:  Normal effort  MSK:   Moves extremities without difficulty  Other:  LLQ abdominal TTP   Medical Decision Making  Medically screening exam initiated at 8:45 PM.  Appropriate orders placed.  PAULA BUSENBARK was informed that the remainder of the evaluation will be completed by another provider, this initial triage assessment does not replace that evaluation, and the importance of remaining in the ED until their evaluation is complete.     Mickie Hillier, PA-C 06/15/22 2047

## 2022-06-15 NOTE — ED Triage Notes (Signed)
Pt here via POV for [place] transferred for US Pelvic. Denies any change in symptoms. VS WDL.

## 2022-06-15 NOTE — Discharge Instructions (Signed)
Continue Tylenol for your pain.  Please avoid doing any heavy lifting or strenuous activities until you are pain-free.

## 2022-06-15 NOTE — ED Provider Notes (Signed)
Patient transferred from [facility] for pelvic ultrasound.  Left lower quadrant abdominal pain.  History of ovarian cyst, diverticulitis.  Blood work CBC and CMP and lipase and urinalysis per my review interpretation all unremarkable.  No significant leukocytosis, anemia, electrolyte abnormality.  Pelvic ultrasound with some small left-sided ovarian cyst.  No torsion.  Overall no signs of ruptured cyst.  We will get a CT scan of the abdomen pelvis to further evaluate for diverticulitis.  This could be muscular as pain seems to be in the left hip flexor.  CT scan is unremarkable.  Overall suspect may be a muscular process.  Discharged in good condition.  This chart was dictated using voice recognition software.  Despite best efforts to proofread,  errors can occur which can change the documentation meaning.    Lennice Sites, DO 06/15/22 2248

## 2022-06-15 NOTE — ED Provider Notes (Signed)
[facility] EMERGENCY DEPT Provider Note   CSN: [id number] Arrival date & time: 06/15/22  1727     History  Chief Complaint  Patient presents with   Abdominal Pain    Lower Left    Heather Lloyd is a 31 y.o. female.  31 year old female with a history of factor V Leiden and Antithrombin III deficiency on Eliquis, PCOS, large ovarian cyst status post right oophorectomy, and diverticulitis who presents emergency department with left lower quadrant abdominal pain.  Says that it started last night and was similar to a menstrual cycle cramp but says that it is more severe.  Says it is localized to the left lower quadrant and has waxed and waned and is 10 out of 10 in severity at its worst and is currently a 7 out of 10 in severity.  Says that she has a history of cyst in her other ovary and had a surgical removal and that this initially felt similar but is now more severe.  Denies any fevers, chills, diarrhea or constipation.  Says it did hurt to have a bowel movement last night but feels this is due to the pain that she was already having.   Abdominal Pain      Home Medications Prior to Admission medications   Medication Sig Start Date End Date Taking? Authorizing Provider  apixaban (ELIQUIS) 5 MG TABS tablet Take 1 tablet (5 mg total) by mouth 2 (two) times daily. 03/02/22   Volanda Napoleon, MD  polyethylene glycol (MIRALAX / GLYCOLAX) 17 g packet Take 17 g by mouth daily as needed.    [provider]  Prenatal Vit-Fe Fumarate-FA (PRENATAL MULTIVITAMIN) TABS tablet Take 1 tablet by mouth daily at 12 noon.    [provider]      Allergies    Codeine and Ibuprofen    Review of Systems   Review of Systems  Gastrointestinal:  Positive for abdominal pain.    Physical Exam Updated Vital Signs BP 121/76   Pulse 86   Temp 98.3 F (36.8 C) (Oral)   Resp 18   Ht '5\' 10"'$  (1.778 m)   Wt 117.9 kg   SpO2 100%   BMI 37.31 kg/m  Physical Exam Vitals  and nursing note reviewed.  Constitutional:      General: She is not in acute distress.    Appearance: She is well-developed.  HENT:     Head: Normocephalic and atraumatic.     Right Ear: External ear normal.     Left Ear: External ear normal.     Nose: Nose normal.  Eyes:     Extraocular Movements: Extraocular movements intact.     Conjunctiva/sclera: Conjunctivae normal.     Pupils: Pupils are equal, round, and reactive to light.  Cardiovascular:     Rate and Rhythm: Normal rate and regular rhythm.  Pulmonary:     Effort: Pulmonary effort is normal. No respiratory distress.  Abdominal:     General: Abdomen is flat. There is no distension.     Palpations: Abdomen is soft. There is no mass.     Tenderness: There is abdominal tenderness in the left lower quadrant. There is guarding.  Musculoskeletal:        General: No swelling.     Cervical back: Normal range of motion and neck supple.     Right lower leg: No edema.     Left lower leg: No edema.  Skin:    General: Skin is warm and  dry.     Capillary Refill: Capillary refill takes less than 2 seconds.  Neurological:     Mental Status: She is alert and oriented to person, place, and time. Mental status is at baseline.  Psychiatric:        Mood and Affect: Mood normal.     ED Results / Procedures / Treatments   Labs (all labs ordered are listed, but only abnormal results are displayed) Labs Reviewed  COMPREHENSIVE METABOLIC PANEL - Abnormal; Notable for the following components:      Result Value   CO2 21 (*)    All other components within normal limits  URINALYSIS, ROUTINE W REFLEX MICROSCOPIC - Abnormal; Notable for the following components:   Color, Urine COLORLESS (*)    All other components within normal limits  LIPASE, BLOOD  CBC  PREGNANCY, URINE    EKG None  Radiology No results found.  Procedures Procedures   Medications Ordered in ED Medications - No data to display  ED Course/ Medical Decision  Making/ A&P                           Medical Decision Making 31 year old female with a history of factor V Leiden and Antithrombin III deficiency on Eliquis, PCOS, large ovarian cyst status post right oophorectomy, and diverticulitis who presents emergency department with left lower quadrant abdominal pain.  Concerned about possible ovarian cyst, torsion, and diverticulitis.  She is overall well-appearing but does have severe pain and voluntary guarding on exam.  Due to concerns for possible torsion we will transfer her to [facility] for emergent ultrasound since we do not have that present here.  Since she is able to drive herself and with likely long wait times for ambulance transport she will drive her self by private vehicle.  Ultrasound to rule out torsion has been ordered for her which she will receive on arrival.  If this is negative suggest working her up further for possible diverticulitis.  Pelvic exam deferred in the interest of time. Pt accepted by Dr Blair Hailey  Amount and/or Complexity of Data Reviewed Labs: ordered. Radiology: ordered.    Final Clinical Impression(s) / ED Diagnoses Final diagnoses:  Left lower quadrant pain    Rx / DC Orders ED Discharge Orders     None         Fransico Meadow, MD 06/15/22 [phone]

## 2022-06-15 NOTE — ED Triage Notes (Addendum)
Patient arrives with complaints worsening LLQ abdominal pain x2 days. Patient states that she has a history of ovarian cyst (right ovary removed due to cyst).  Patient also reports worsening period cramps as well.  Recently had a baby in February of this year.  Rates pain a 7/10

## 2022-07-19 ENCOUNTER — Encounter: Payer: Self-pay | Admitting: Hematology & Oncology

## 2022-07-19 ENCOUNTER — Inpatient Hospital Stay: Payer: Managed Care, Other (non HMO) | Admitting: Hematology & Oncology

## 2022-07-19 ENCOUNTER — Encounter: Payer: Self-pay | Admitting: *Deleted

## 2022-07-19 ENCOUNTER — Inpatient Hospital Stay: Payer: Managed Care, Other (non HMO) | Attending: Hematology & Oncology

## 2022-07-19 VITALS — BP 112/74 | HR 82 | Temp 98.2°F | Resp 18 | Wt 279.4 lb

## 2022-07-19 DIAGNOSIS — D6851 Activated protein C resistance: Secondary | ICD-10-CM | POA: Diagnosis present

## 2022-07-19 DIAGNOSIS — R160 Hepatomegaly, not elsewhere classified: Secondary | ICD-10-CM

## 2022-07-19 DIAGNOSIS — Z86718 Personal history of other venous thrombosis and embolism: Secondary | ICD-10-CM | POA: Diagnosis not present

## 2022-07-19 DIAGNOSIS — Z7901 Long term (current) use of anticoagulants: Secondary | ICD-10-CM | POA: Insufficient documentation

## 2022-07-19 DIAGNOSIS — Z86711 Personal history of pulmonary embolism: Secondary | ICD-10-CM | POA: Diagnosis not present

## 2022-07-19 DIAGNOSIS — I871 Compression of vein: Secondary | ICD-10-CM | POA: Diagnosis not present

## 2022-07-19 LAB — CMP (CANCER CENTER ONLY)
ALT: 18 U/L (ref 0–44)
AST: 13 U/L — ABNORMAL LOW (ref 15–41)
Albumin: 4.5 g/dL (ref 3.5–5.0)
Alkaline Phosphatase: 60 U/L (ref 38–126)
Anion gap: 9 (ref 5–15)
BUN: 17 mg/dL (ref 6–20)
CO2: 24 mmol/L (ref 22–32)
Calcium: 9.5 mg/dL (ref 8.9–10.3)
Chloride: 104 mmol/L (ref 98–111)
Creatinine: 0.99 mg/dL (ref 0.44–1.00)
GFR, Estimated: >60 mL/min (ref >60)
Glucose, Bld: 142 mg/dL — ABNORMAL HIGH (ref 70–99)
Potassium: 3.7 mmol/L (ref 3.5–5.1)
Sodium: 137 mmol/L (ref 135–145)
Total Bilirubin: 0.4 mg/dL (ref 0.3–1.2)
Total Protein: 7.7 g/dL (ref 6.5–8.1)

## 2022-07-19 LAB — CBC WITH DIFFERENTIAL (CANCER CENTER ONLY)
Abs Immature Granulocytes: 0.04 10*3/uL (ref 0.00–0.07)
Basophils Absolute: 0 10*3/uL (ref 0.0–0.1)
Basophils Relative: 0 %
Eosinophils Absolute: 0.2 10*3/uL (ref 0.0–0.5)
Eosinophils Relative: 2 %
HCT: 37.2 % (ref 36.0–46.0)
Hemoglobin: 12.1 g/dL (ref 12.0–15.0)
Immature Granulocytes: 0 %
Lymphocytes Relative: 29 %
Lymphs Abs: 2.9 10*3/uL (ref 0.7–4.0)
MCH: 26.6 pg (ref 26.0–34.0)
MCHC: 32.5 g/dL (ref 30.0–36.0)
MCV: 81.8 fL (ref 80.0–100.0)
Monocytes Absolute: 0.6 10*3/uL (ref 0.1–1.0)
Monocytes Relative: 6 %
Neutro Abs: 6.2 10*3/uL (ref 1.7–7.7)
Neutrophils Relative %: 63 %
Platelet Count: 259 10*3/uL (ref 150–400)
RBC: 4.55 MIL/uL (ref 3.87–5.11)
RDW: 14.4 % (ref 11.5–15.5)
WBC Count: 9.9 10*3/uL (ref 4.0–10.5)
nRBC: 0 % (ref 0.0–0.2)

## 2022-07-19 LAB — D-DIMER, QUANTITATIVE: D-Dimer, Quant: 0.39 ug/mL-FEU (ref 0.00–0.50)

## 2022-07-19 NOTE — Progress Notes (Signed)
Hematology and Oncology Follow Up Visit  Heather Lloyd [id number] [birth year] 31 y.o. 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  Her baby is now about 34 months old.  Heather Lloyd weighs about 23 pounds.  Heather Lloyd has a new job.  Heather Lloyd is quite busy with her new job.  Heather Lloyd is doing well on the Heather Lloyd.  Heather Lloyd is having no problems with bleeding.  Recently, Heather Lloyd has some pain in the left inguinal area.  Heather Lloyd went to the emergency room.  Heather Lloyd had a CT scan of the abdomen pelvis.  This was done on 06/15/2022.  This did not show anything that looked like thromboembolic disease.  Heather Lloyd has some collateral vessels.  Heather Lloyd did have some hepatosplenomegaly.  There is no abnormalities noted within the liver or spleen.  There is no lymphadenopathy.  The pain did seem to get better.  Heather Lloyd has had no problems with her legs.  Heather Lloyd has had no leg swelling.  There is been no cough or shortness of breath.  Heather Lloyd has had no fever.  Overall, I would say performance status is probably ECOG 1.    Medications:  Allergies as of 07/19/2022       Reactions   Codeine Hives, Swelling   Ibuprofen Other (See Comments)   Pt is on blood thinners        Medication List        Accurate as of July 19, 2022  2:35 PM. If you have any questions, ask your nurse or doctor.          apixaban 5 MG Tabs tablet Commonly known as: Heather Lloyd Take 1 tablet (5 mg total) by mouth 2 (two) times daily.   polyethylene glycol 17 g packet Commonly known as: MIRALAX / GLYCOLAX Take 17 g by mouth daily as needed.   prenatal multivitamin Tabs tablet Take 1 tablet by mouth daily at 12 noon.        Allergies:   Allergies  Allergen Reactions   Codeine Hives and Swelling   Ibuprofen Other (See Comments)    Pt is on blood thinners     Past Medical History, Surgical history, Social history, and Family History were reviewed and updated.  Review of Systems: Review of Systems  Constitutional: Negative.   HENT: Negative.    Eyes: Negative.   Respiratory: Negative.    Cardiovascular: Negative.   Gastrointestinal: Negative.   Genitourinary: Negative.   Musculoskeletal: Negative.   Skin: Negative.   Neurological: Negative.   Endo/Heme/Allergies: Negative.   Psychiatric/Behavioral: Negative.       Physical Exam:  weight is 279 lb 6.4 oz (126.7 kg). Her oral temperature is 98.2 F (36.8 C). Her blood pressure is 112/74 and her pulse is 82. Her respiration is 18 and oxygen saturation is 98%.   Wt Readings from Last 3 Encounters:  07/19/22 279 lb 6.4 oz (126.7 kg)  06/15/22 260 lb (117.9 kg)  03/12/22 267 lb 12.8 oz (121.5 kg)    Physical Exam Vitals reviewed.  HENT:     Head: Normocephalic and atraumatic.  Eyes:     Pupils: Pupils are equal, round, and reactive to  light.  Cardiovascular:     Rate and Rhythm: Normal rate and regular rhythm.     Heart sounds: Normal heart sounds.  Pulmonary:     Effort: Pulmonary effort is normal.     Breath sounds: Normal breath sounds.  Abdominal:     General: Bowel sounds are normal.     Comments: Abdominal exam shows a pregnant abdomen.  Heather Lloyd has decent bowel sounds.  Heather Lloyd has couple areas of ecchymoses where Heather Lloyd injected her Lovenox/heparin.  There is no fluid wave.  There is no palpable liver or spleen tip.    Musculoskeletal:        General: No tenderness or deformity. Normal range of motion.     Cervical back: Normal range of motion.     Comments: Extremity shows no clubbing, cyanosis or edema.  Heather Lloyd has very little swelling in the lower legs.  Heather Lloyd has good strength.  Heather Lloyd has good range of motion.  Lymphadenopathy:     Cervical: No cervical  adenopathy.  Skin:    General: Skin is warm and dry.     Findings: No erythema or rash.  Neurological:     Mental Status: Heather Lloyd is alert and oriented to person, place, and time.  Psychiatric:        Behavior: Behavior normal.        Thought Content: Thought content normal.        Judgment: Judgment normal.    Lab Results  Component Value Date   WBC 9.9 07/19/2022   HGB 12.1 07/19/2022   HCT 37.2 07/19/2022   MCV 81.8 07/19/2022   PLT 259 07/19/2022   No results found for: "FERRITIN", "IRON", "TIBC", "UIBC", "IRONPCTSAT" Lab Results  Component Value Date   RETICCTPCT 2.24 (H) 04/25/2017   RBC 4.55 07/19/2022   RETICCTABS 105.73 (H) 04/25/2017   No results found for: "KPAFRELGTCHN", "LAMBDASER", "KAPLAMBRATIO" No results found for: "IGGSERUM", "IGA", "IGMSERUM" No results found for: "TOTALPROTELP", "ALBUMINELP", "A1GS", "A2GS", "BETS", "BETA2SER", "GAMS", "MSPIKE", "SPEI"   Chemistry      Component Value Date/Time   NA 140 06/15/2022 1801   NA 140 04/25/2017 1550   K 3.8 06/15/2022 1801   K 3.8 04/25/2017 1550   CL 106 06/15/2022 1801   CO2 21 (L) 06/15/2022 1801   CO2 26 04/25/2017 1550   BUN 10 06/15/2022 1801   BUN 12.5 04/25/2017 1550   CREATININE 0.80 06/15/2022 1801   CREATININE 0.96 03/12/2022 1330   CREATININE 1.0 04/25/2017 1550      Component Value Date/Time   CALCIUM 9.1 06/15/2022 1801   CALCIUM 9.6 04/25/2017 1550   ALKPHOS 54 06/15/2022 1801   ALKPHOS 60 04/25/2017 1550   AST 22 06/15/2022 1801   AST 14 (L) 03/12/2022 1330   AST 23 04/25/2017 1550   ALT 31 06/15/2022 1801   ALT 25 03/12/2022 1330   ALT 28 04/25/2017 1550   BILITOT 0.5 06/15/2022 1801   BILITOT 0.4 03/12/2022 1330   BILITOT 0.48 04/25/2017 1550       Impression and Plan: Heather Lloyd is a very pleasant 31 yo caucasian female with history of left lower extremity DVT and bilateral pulmonary emboli, factor V Leiden mutation-heterozygous, antithrombin III deficiency and 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    We will continue on the Heather Lloyd.  I would like to get another CT scan to assess this hepatosplenomegaly.  I am not sure as to why Heather Lloyd exactly have this.  There is no obvious abnormalities noted  within either organ.  I would like to get a CT scan when we see her back in 3 months.  I am so glad that her baby is doing well.   Volanda Napoleon, MD 9/7/20232:35 PM

## 2022-07-19 NOTE — Progress Notes (Signed)
Eliquis has been approved by plan via Cover My Meds as of 07/19/22-07/19/23.  Case ID [id number].

## 2022-10-17 ENCOUNTER — Encounter: Payer: Self-pay | Admitting: Hematology & Oncology

## 2022-10-17 ENCOUNTER — Telehealth: Payer: Self-pay

## 2022-10-17 NOTE — Telephone Encounter (Signed)
Received phone call from patient stating she has an appointment 10/18/2022 that included a CT scan prior to appointment in our department. Pt stated that she had an emergency CT scan earlier this week and inquiring if she needed to have another scan completed. Pt stated she was able to see her results from her scan in her MyChart account. Reviewed pt questions with Lottie Dawson NP and pt does not need another CT scan prior to appointment. This RN requested that patient send her scan results from [place] to our office via [facility] as results not seen in [place]. Pt verbalized understanding and aware of her appointments for 10/18/2022. Pt stated she will send her results via MyChart.

## 2022-10-18 ENCOUNTER — Encounter: Payer: Self-pay | Admitting: Medical Oncology

## 2022-10-18 ENCOUNTER — Encounter (HOSPITAL_BASED_OUTPATIENT_CLINIC_OR_DEPARTMENT_OTHER): Payer: Self-pay

## 2022-10-18 ENCOUNTER — Inpatient Hospital Stay (HOSPITAL_BASED_OUTPATIENT_CLINIC_OR_DEPARTMENT_OTHER): Payer: Managed Care, Other (non HMO) | Admitting: Medical Oncology

## 2022-10-18 ENCOUNTER — Other Ambulatory Visit: Payer: Self-pay

## 2022-10-18 ENCOUNTER — Inpatient Hospital Stay: Payer: Managed Care, Other (non HMO) | Attending: Hematology & Oncology

## 2022-10-18 ENCOUNTER — Ambulatory Visit (HOSPITAL_BASED_OUTPATIENT_CLINIC_OR_DEPARTMENT_OTHER): Payer: Managed Care, Other (non HMO)

## 2022-10-18 VITALS — BP 116/77 | HR 72 | Temp 97.7°F | Ht 70.0 in | Wt 277.4 lb

## 2022-10-18 DIAGNOSIS — Z86718 Personal history of other venous thrombosis and embolism: Secondary | ICD-10-CM | POA: Insufficient documentation

## 2022-10-18 DIAGNOSIS — D6851 Activated protein C resistance: Secondary | ICD-10-CM | POA: Insufficient documentation

## 2022-10-18 DIAGNOSIS — R102 Pelvic and perineal pain: Secondary | ICD-10-CM

## 2022-10-18 DIAGNOSIS — Z86711 Personal history of pulmonary embolism: Secondary | ICD-10-CM | POA: Insufficient documentation

## 2022-10-18 DIAGNOSIS — E282 Polycystic ovarian syndrome: Secondary | ICD-10-CM

## 2022-10-18 DIAGNOSIS — Z7901 Long term (current) use of anticoagulants: Secondary | ICD-10-CM | POA: Diagnosis not present

## 2022-10-18 DIAGNOSIS — R160 Hepatomegaly, not elsewhere classified: Secondary | ICD-10-CM

## 2022-10-18 DIAGNOSIS — G8929 Other chronic pain: Secondary | ICD-10-CM

## 2022-10-18 DIAGNOSIS — I82512 Chronic embolism and thrombosis of left femoral vein: Secondary | ICD-10-CM | POA: Diagnosis not present

## 2022-10-18 LAB — CMP (CANCER CENTER ONLY)
ALT: 34 U/L (ref 0–44)
AST: 26 U/L (ref 15–41)
Albumin: 3.7 g/dL (ref 3.5–5.0)
Alkaline Phosphatase: 50 U/L (ref 38–126)
Anion gap: 6 (ref 5–15)
BUN: 12 mg/dL (ref 6–20)
CO2: 23 mmol/L (ref 22–32)
Calcium: 8.5 mg/dL — ABNORMAL LOW (ref 8.9–10.3)
Chloride: 110 mmol/L (ref 98–111)
Creatinine: 0.89 mg/dL (ref 0.44–1.00)
GFR, Estimated: >60 mL/min (ref >60)
Glucose, Bld: 108 mg/dL — ABNORMAL HIGH (ref 70–99)
Potassium: 3.8 mmol/L (ref 3.5–5.1)
Sodium: 139 mmol/L (ref 135–145)
Total Bilirubin: 0.3 mg/dL (ref 0.3–1.2)
Total Protein: 7.1 g/dL (ref 6.5–8.1)

## 2022-10-18 LAB — CBC WITH DIFFERENTIAL (CANCER CENTER ONLY)
Abs Immature Granulocytes: 0.06 10*3/uL (ref 0.00–0.07)
Basophils Absolute: 0 10*3/uL (ref 0.0–0.1)
Basophils Relative: 0 %
Eosinophils Absolute: 0.1 10*3/uL (ref 0.0–0.5)
Eosinophils Relative: 1 %
HCT: 35.9 % — ABNORMAL LOW (ref 36.0–46.0)
Hemoglobin: 11.7 g/dL — ABNORMAL LOW (ref 12.0–15.0)
Immature Granulocytes: 1 %
Lymphocytes Relative: 35 %
Lymphs Abs: 2.3 10*3/uL (ref 0.7–4.0)
MCH: 26.4 pg (ref 26.0–34.0)
MCHC: 32.6 g/dL (ref 30.0–36.0)
MCV: 81 fL (ref 80.0–100.0)
Monocytes Absolute: 0.4 10*3/uL (ref 0.1–1.0)
Monocytes Relative: 6 %
Neutro Abs: 3.9 10*3/uL (ref 1.7–7.7)
Neutrophils Relative %: 57 %
Platelet Count: 226 10*3/uL (ref 150–400)
RBC: 4.43 MIL/uL (ref 3.87–5.11)
RDW: 14.5 % (ref 11.5–15.5)
Smear Review: NORMAL
WBC Count: 6.8 10*3/uL (ref 4.0–10.5)
nRBC: 0 % (ref 0.0–0.2)

## 2022-10-18 LAB — D-DIMER, QUANTITATIVE: D-Dimer, Quant: 0.44 ug/mL-FEU (ref 0.00–0.50)

## 2022-10-18 LAB — LACTATE DEHYDROGENASE: LDH: 162 U/L (ref 98–192)

## 2022-10-18 NOTE — Progress Notes (Signed)
Hematology and Oncology Follow Up Visit  Heather Lloyd [id number] Nov 07, 1991 31 y.o. 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  Her baby is now about 32 months old.    She reports that she has been having abdominal pains. Was seen in the [facility] on Monday and had a CT scan performed. Showed gallstones without gallbladder thickening or sludge, an umbilical hernia without obstruction, an ovarian cyst and some diverticula without inflammation. She reports that she has follow up with GI, GYN and her PCP this week. Feeling better today but is concerned about symptoms.   She denies any bleeding episodes, no SOB,   There is been no cough or shortness of breath.  She has had no fever.  Overall, I would say performance status is probably ECOG 1.    Medications:  Allergies as of 10/18/2022       Reactions   Codeine Hives, Swelling   Ibuprofen Other (See Comments)   Pt is on blood thinners        Medication List        Accurate as of October 18, 2022  9:56 AM. If you have any questions, ask your nurse or doctor.          apixaban 5 MG Tabs tablet Commonly known as: Eliquis Take 1 tablet (5 mg total) by mouth 2 (two) times daily.   cyclobenzaprine 10 MG tablet Commonly known as: FLEXERIL Take 10 mg by mouth 3 (three) times daily as needed for muscle spasms (period pain).   polyethylene glycol 17 g packet Commonly known as: MIRALAX / GLYCOLAX Take 17 g by mouth daily as needed.   prenatal multivitamin Tabs tablet Take 1 tablet by mouth daily at 12 noon.        Allergies:  Allergies  Allergen Reactions   Codeine Hives and Swelling    Ibuprofen Other (See Comments)    Pt is on blood thinners     Past Medical History, Surgical history, Social history, and Family History were reviewed and updated.  Review of Systems: Review of Systems  Constitutional: Negative.   HENT: Negative.    Eyes: Negative.   Respiratory: Negative.    Cardiovascular: Negative.   Gastrointestinal: Negative.   Genitourinary: Negative.   Musculoskeletal: Negative.   Skin: Negative.   Neurological: Negative.   Endo/Heme/Allergies: Negative.   Psychiatric/Behavioral: Negative.       Physical Exam:  height is '5\' 10"'$  (1.778 m) and weight is 277 lb 6.4 oz (125.8 kg). Her oral temperature is 97.7 F (36.5 C). Her blood pressure is 116/77 and her pulse is 53 (abnormal). Her oxygen saturation is 99%.   Wt Readings from Last 3 Encounters:  10/18/22 277 lb 6.4 oz (125.8 kg)  07/19/22 279 lb 6.4 oz (126.7 kg)  06/15/22 260 lb (117.9 kg)    Physical Exam Vitals reviewed.  HENT:     Head: Normocephalic and atraumatic.  Eyes:     Pupils: Pupils are equal, round, and reactive to light.  Cardiovascular:     Rate and Rhythm: Normal rate and regular rhythm.     Heart sounds: Normal heart sounds.  Pulmonary:  Effort: Pulmonary effort is normal.     Breath sounds: Normal breath sounds.  Abdominal:     General: Bowel sounds are normal.  Musculoskeletal:        General: No tenderness or deformity. Normal range of motion.     Cervical back: Normal range of motion.     Comments: Extremity shows no clubbing, cyanosis or edema.  She has very little swelling in the lower legs.  She has good strength.  She has good range of motion.  Lymphadenopathy:     Cervical: No cervical adenopathy.  Skin:    General: Skin is warm and dry.     Findings: No erythema or rash.  Neurological:     Mental Status: She is alert and oriented to person, place, and time.  Psychiatric:        Behavior: Behavior normal.        Thought Content: Thought content normal.         Judgment: Judgment normal.     Lab Results  Component Value Date   WBC 6.8 10/18/2022   HGB 11.7 (L) 10/18/2022   HCT 35.9 (L) 10/18/2022   MCV 81.0 10/18/2022   PLT 226 10/18/2022   No results found for: "FERRITIN", "IRON", "TIBC", "UIBC", "IRONPCTSAT" Lab Results  Component Value Date   RETICCTPCT 2.24 (H) 04/25/2017   RBC 4.43 10/18/2022   RETICCTABS 105.73 (H) 04/25/2017   No results found for: "KPAFRELGTCHN", "LAMBDASER", "KAPLAMBRATIO" No results found for: "IGGSERUM", "IGA", "IGMSERUM" No results found for: "TOTALPROTELP", "ALBUMINELP", "A1GS", "A2GS", "BETS", "BETA2SER", "GAMS", "MSPIKE", "SPEI"   Chemistry      Component Value Date/Time   NA 139 10/18/2022 0908   NA 140 04/25/2017 1550   K 3.8 10/18/2022 0908   K 3.8 04/25/2017 1550   CL 110 10/18/2022 0908   CO2 23 10/18/2022 0908   CO2 26 04/25/2017 1550   BUN 12 10/18/2022 0908   BUN 12.5 04/25/2017 1550   CREATININE 0.89 10/18/2022 0908   CREATININE 1.0 04/25/2017 1550      Component Value Date/Time   CALCIUM 8.5 (L) 10/18/2022 0908   CALCIUM 9.6 04/25/2017 1550   ALKPHOS 50 10/18/2022 0908   ALKPHOS 60 04/25/2017 1550   AST 26 10/18/2022 0908   AST 23 04/25/2017 1550   ALT 34 10/18/2022 0908   ALT 28 04/25/2017 1550   BILITOT 0.3 10/18/2022 0908   BILITOT 0.48 04/25/2017 1550      Encounter Diagnoses  Name Primary?   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    We will  continue on the Eliquis. From our stand point she is doing well. Hemoglobin has dropped a bit- she reports no bleeding episodes. I have recommended repeat CBC within 2 weeks. Regular follow up in 3 months or sooner as needed.   Glad she is feeling better. We discussed her CT results. I have recommended that she have follow up with GI, GYN and her PCP. She may  benefit from a transvaginal US for her ovarian cyst and right pelvic pain. She would likely benefit from a HIDA scan which GI will likely order after evaluating her tomorrow. I have also recommended that she go forward with the surgical consult suggested by PCP given her hernia and the gallstones. For now I have recommended a low fat diet to help prevent GI pain.    I spent 25 minutes dedicated to the care of this patient (face to face and non-face to face) on the date of this encounter to include review of recent labs/ imaging studies. Explaining the study reports and results and organizing a plan of action to address her concerns.   Hughie Closs, PA-C 12/7/20239:56 AM

## 2022-11-01 ENCOUNTER — Other Ambulatory Visit: Payer: Self-pay | Admitting: *Deleted

## 2022-11-01 DIAGNOSIS — D6851 Activated protein C resistance: Secondary | ICD-10-CM

## 2022-11-01 DIAGNOSIS — Z86718 Personal history of other venous thrombosis and embolism: Secondary | ICD-10-CM

## 2022-11-01 DIAGNOSIS — R160 Hepatomegaly, not elsewhere classified: Secondary | ICD-10-CM

## 2022-11-01 DIAGNOSIS — I82512 Chronic embolism and thrombosis of left femoral vein: Secondary | ICD-10-CM

## 2022-11-02 ENCOUNTER — Inpatient Hospital Stay: Payer: Managed Care, Other (non HMO) | Admitting: Hematology & Oncology

## 2022-11-02 ENCOUNTER — Encounter: Payer: Self-pay | Admitting: Hematology & Oncology

## 2022-11-02 ENCOUNTER — Inpatient Hospital Stay: Payer: Managed Care, Other (non HMO)

## 2022-11-02 VITALS — BP 101/62 | HR 70 | Temp 98.4°F | Resp 18 | Ht 70.0 in | Wt 272.0 lb

## 2022-11-02 DIAGNOSIS — D6851 Activated protein C resistance: Secondary | ICD-10-CM

## 2022-11-02 DIAGNOSIS — Z86718 Personal history of other venous thrombosis and embolism: Secondary | ICD-10-CM

## 2022-11-02 DIAGNOSIS — R160 Hepatomegaly, not elsewhere classified: Secondary | ICD-10-CM

## 2022-11-02 DIAGNOSIS — I82512 Chronic embolism and thrombosis of left femoral vein: Secondary | ICD-10-CM

## 2022-11-02 LAB — CMP (CANCER CENTER ONLY)
ALT: 48 U/L — ABNORMAL HIGH (ref 0–44)
AST: 29 U/L (ref 15–41)
Albumin: 4.6 g/dL (ref 3.5–5.0)
Alkaline Phosphatase: 63 U/L (ref 38–126)
Anion gap: 9 (ref 5–15)
BUN: 12 mg/dL (ref 6–20)
CO2: 25 mmol/L (ref 22–32)
Calcium: 9.4 mg/dL (ref 8.9–10.3)
Chloride: 106 mmol/L (ref 98–111)
Creatinine: 0.92 mg/dL (ref 0.44–1.00)
GFR, Estimated: >60 mL/min (ref >60)
Glucose, Bld: 107 mg/dL — ABNORMAL HIGH (ref 70–99)
Potassium: 4.1 mmol/L (ref 3.5–5.1)
Sodium: 140 mmol/L (ref 135–145)
Total Bilirubin: 0.5 mg/dL (ref 0.3–1.2)
Total Protein: 7.8 g/dL (ref 6.5–8.1)

## 2022-11-02 LAB — CBC WITH DIFFERENTIAL (CANCER CENTER ONLY)
Abs Immature Granulocytes: 0.03 10*3/uL (ref 0.00–0.07)
Basophils Absolute: 0 10*3/uL (ref 0.0–0.1)
Basophils Relative: 1 %
Eosinophils Absolute: 0.1 10*3/uL (ref 0.0–0.5)
Eosinophils Relative: 1 %
HCT: 39.2 % (ref 36.0–46.0)
Hemoglobin: 12.7 g/dL (ref 12.0–15.0)
Immature Granulocytes: 0 %
Lymphocytes Relative: 31 %
Lymphs Abs: 2.2 10*3/uL (ref 0.7–4.0)
MCH: 26.5 pg (ref 26.0–34.0)
MCHC: 32.4 g/dL (ref 30.0–36.0)
MCV: 81.8 fL (ref 80.0–100.0)
Monocytes Absolute: 0.4 10*3/uL (ref 0.1–1.0)
Monocytes Relative: 6 %
Neutro Abs: 4.4 10*3/uL (ref 1.7–7.7)
Neutrophils Relative %: 61 %
Platelet Count: 279 10*3/uL (ref 150–400)
RBC: 4.79 MIL/uL (ref 3.87–5.11)
RDW: 14.4 % (ref 11.5–15.5)
WBC Count: 7.2 10*3/uL (ref 4.0–10.5)
nRBC: 0 % (ref 0.0–0.2)

## 2022-11-02 LAB — LACTATE DEHYDROGENASE: LDH: 153 U/L (ref 98–192)

## 2022-11-02 LAB — D-DIMER, QUANTITATIVE: D-Dimer, Quant: 0.51 ug/mL-FEU — ABNORMAL HIGH (ref 0.00–0.50)

## 2022-11-02 MED ORDER — DICYCLOMINE HCL 10 MG PO CAPS: 10.0000 mg | ORAL_CAPSULE | Freq: Three times a day (TID) | ORAL | 2 refills | Status: DC

## 2022-11-02 NOTE — Progress Notes (Signed)
Ms. Hematology and Oncology Follow Up Visit  Heather Lloyd [id number] 07/05/1991 31 y.o. 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  This is a little bit early follow-up for her.  She was seen by Nelwyn Salisbury just 2 or 3 weeks ago.  She was having a lot of problems that time with abdominal discomfort.  She has seen different doctors.  She actually had to go to the emergency room.  She did have a CT scan that was done.  This showed some slight hepatosplenomegaly.  Otherwise, everything else looked okay.  She is going to have a colonoscopy in about 3 weeks.  I told her to stop the Eliquis.  She did not have any vomiting.  She does has some abdominal discomfort.  This only sounds like irritable bowel.  She not having diarrhea.  I thought that maybe we could try some Bentyl on her.  I will try her on 10 mg p.o. 3 times daily to see if this may help.  Her baby is doing well.  Baby is 62 months old now.  Everything seems to be fine with her.  She has had no chest wall pain.  There is been no cough.  There is been no leg pain or swelling.  Currently, I would have to say that her performance status is probably ECOG 1.    Medications:  Allergies as of 11/02/2022       Reactions   Codeine Hives, Swelling   Ibuprofen Other (See Comments)   Pt is on blood thinners        Medication List        Accurate as of November 02, 2022 10:54 AM. If you have any questions, ask your nurse or doctor.          apixaban 5 MG Tabs tablet Commonly known as: Eliquis Take 1 tablet (5 mg total) by mouth 2 (two) times daily.   cyclobenzaprine 10 MG  tablet Commonly known as: FLEXERIL Take 10 mg by mouth 3 (three) times daily as needed for muscle spasms (period pain).   polyethylene glycol 17 g packet Commonly known as: MIRALAX / GLYCOLAX Take 17 g by mouth daily as needed.   prenatal multivitamin Tabs tablet Take 1 tablet by mouth daily at 12 noon.        Allergies:  Allergies  Allergen Reactions   Codeine Hives and Swelling   Ibuprofen Other (See Comments)    Pt is on blood thinners     Past Medical History, Surgical history, Social history, and Family History were reviewed and updated.  Review of Systems: Review of Systems  Constitutional: Negative.   HENT: Negative.    Eyes: Negative.   Respiratory: Negative.    Cardiovascular: Negative.   Gastrointestinal: Negative.   Genitourinary: Negative.   Musculoskeletal: Negative.   Skin: Negative.   Neurological: Negative.   Endo/Heme/Allergies: Negative.   Psychiatric/Behavioral: Negative.       Physical Exam:  height is '5\' 10"'$  (1.778 m) and weight is 272 lb (123.4 kg). Her oral temperature is 98.4 F (36.9 C). Her blood pressure is 101/62 and her pulse is 70.  Her respiration is 18 and oxygen saturation is 99%.   Wt Readings from Last 3 Encounters:  11/02/22 272 lb (123.4 kg)  10/18/22 277 lb 6.4 oz (125.8 kg)  07/19/22 279 lb 6.4 oz (126.7 kg)    Physical Exam Vitals reviewed.  HENT:     Head: Normocephalic and atraumatic.  Eyes:     Pupils: Pupils are equal, round, and reactive to light.  Cardiovascular:     Rate and Rhythm: Normal rate and regular rhythm.     Heart sounds: Normal heart sounds.  Pulmonary:     Effort: Pulmonary effort is normal.     Breath sounds: Normal breath sounds.  Abdominal:     General: Bowel sounds are normal.     Comments: Abdominal exam is soft.  Bowel sounds are present.  There may be a little bit of tenderness in the middle of the abdomen.  There is no obvious fluid wave.  There is no rebound tenderness.  There is no  abdominal mass.  There is no palpable liver or spleen tip.     Musculoskeletal:        General: No tenderness or deformity. Normal range of motion.     Cervical back: Normal range of motion.     Comments: Extremity shows no clubbing, cyanosis or edema.  She has very little swelling in the lower legs.  She has good strength.  She has good range of motion.  Lymphadenopathy:     Cervical: No cervical adenopathy.  Skin:    General: Skin is warm and dry.     Findings: No erythema or rash.  Neurological:     Mental Status: She is alert and oriented to person, place, and time.  Psychiatric:        Behavior: Behavior normal.        Thought Content: Thought content normal.        Judgment: Judgment normal.     Lab Results  Component Value Date   WBC 7.2 11/02/2022   HGB 12.7 11/02/2022   HCT 39.2 11/02/2022   MCV 81.8 11/02/2022   PLT 279 11/02/2022   No results found for: "FERRITIN", "IRON", "TIBC", "UIBC", "IRONPCTSAT" Lab Results  Component Value Date   RETICCTPCT 2.24 (H) 04/25/2017   RBC 4.79 11/02/2022   RETICCTABS 105.73 (H) 04/25/2017   No results found for: "KPAFRELGTCHN", "LAMBDASER", "KAPLAMBRATIO" No results found for: "IGGSERUM", "IGA", "IGMSERUM" No results found for: "TOTALPROTELP", "ALBUMINELP", "A1GS", "A2GS", "BETS", "BETA2SER", "GAMS", "MSPIKE", "SPEI"   Chemistry      Component Value Date/Time   NA 140 11/02/2022 0953   NA 140 04/25/2017 1550   K 4.1 11/02/2022 0953   K 3.8 04/25/2017 1550   CL 106 11/02/2022 0953   CO2 25 11/02/2022 0953   CO2 26 04/25/2017 1550   BUN 12 11/02/2022 0953   BUN 12.5 04/25/2017 1550   CREATININE 0.92 11/02/2022 0953   CREATININE 1.0 04/25/2017 1550      Component Value Date/Time   CALCIUM 9.4 11/02/2022 0953   CALCIUM 9.6 04/25/2017 1550   ALKPHOS 63 11/02/2022 0953   ALKPHOS 60 04/25/2017 1550   AST 29 11/02/2022 0953   AST 23 04/25/2017 1550   ALT 48 (H) 11/02/2022 0953   ALT 28 04/25/2017 1550   BILITOT 0.5  11/02/2022 0953   BILITOT 0.48 04/25/2017 1550       Impression and Plan: Heather Lloyd is a very pleasant 31 yo caucasian female with history of left lower extremity  DVT and bilateral pulmonary emboli, factor V Leiden mutation-heterozygous, antithrombin III deficiency and 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    Again, I am unsure as to why she is having the abdominal issues.  1 thing that I thought of might be some intestinal ischemia.  Typically, people have diarrhea with this.  I thought that maybe if things do not improve, we could do angiogram of her abdomen to see if there is any type of vascular problem.  Maybe, the Bentyl will help her.  Will be interesting to see how the colonoscopy comes.  We will go ahead and plan to get her back to see [place] in another 6 weeks or so.  Hopefully things will have improved.   Volanda Napoleon, MD 12/22/202310:54 AM

## 2022-12-14 ENCOUNTER — Inpatient Hospital Stay: Payer: Managed Care, Other (non HMO) | Admitting: Family

## 2022-12-14 ENCOUNTER — Inpatient Hospital Stay: Payer: Managed Care, Other (non HMO)

## 2022-12-21 ENCOUNTER — Encounter: Payer: Self-pay | Admitting: Family

## 2022-12-21 ENCOUNTER — Inpatient Hospital Stay: Payer: Managed Care, Other (non HMO) | Admitting: Family

## 2022-12-21 ENCOUNTER — Inpatient Hospital Stay: Payer: Managed Care, Other (non HMO) | Attending: Hematology & Oncology

## 2022-12-21 ENCOUNTER — Encounter: Payer: Self-pay | Admitting: Hematology & Oncology

## 2022-12-21 ENCOUNTER — Other Ambulatory Visit: Payer: Self-pay

## 2022-12-21 VITALS — BP 117/69 | HR 82 | Temp 98.1°F | Resp 18 | Ht 70.0 in | Wt 267.1 lb

## 2022-12-21 DIAGNOSIS — I82512 Chronic embolism and thrombosis of left femoral vein: Secondary | ICD-10-CM

## 2022-12-21 DIAGNOSIS — R160 Hepatomegaly, not elsewhere classified: Secondary | ICD-10-CM

## 2022-12-21 DIAGNOSIS — D6851 Activated protein C resistance: Secondary | ICD-10-CM | POA: Diagnosis present

## 2022-12-21 DIAGNOSIS — Z86711 Personal history of pulmonary embolism: Secondary | ICD-10-CM | POA: Diagnosis not present

## 2022-12-21 DIAGNOSIS — Z7901 Long term (current) use of anticoagulants: Secondary | ICD-10-CM | POA: Diagnosis not present

## 2022-12-21 DIAGNOSIS — I871 Compression of vein: Secondary | ICD-10-CM | POA: Diagnosis not present

## 2022-12-21 DIAGNOSIS — Z86718 Personal history of other venous thrombosis and embolism: Secondary | ICD-10-CM | POA: Insufficient documentation

## 2022-12-21 LAB — CMP (CANCER CENTER ONLY)
ALT: 29 U/L (ref 0–44)
AST: 20 U/L (ref 15–41)
Albumin: 4.4 g/dL (ref 3.5–5.0)
Alkaline Phosphatase: 53 U/L (ref 38–126)
Anion gap: 9 (ref 5–15)
BUN: 17 mg/dL (ref 6–20)
CO2: 22 mmol/L (ref 22–32)
Calcium: 9 mg/dL (ref 8.9–10.3)
Chloride: 108 mmol/L (ref 98–111)
Creatinine: 0.78 mg/dL (ref 0.44–1.00)
GFR, Estimated: >60 mL/min (ref >60)
Glucose, Bld: 102 mg/dL — ABNORMAL HIGH (ref 70–99)
Potassium: 4 mmol/L (ref 3.5–5.1)
Sodium: 139 mmol/L (ref 135–145)
Total Bilirubin: 0.4 mg/dL (ref 0.3–1.2)
Total Protein: 7.6 g/dL (ref 6.5–8.1)

## 2022-12-21 LAB — CBC WITH DIFFERENTIAL (CANCER CENTER ONLY)
Abs Immature Granulocytes: 0.02 10*3/uL (ref 0.00–0.07)
Basophils Absolute: 0 10*3/uL (ref 0.0–0.1)
Basophils Relative: 0 %
Eosinophils Absolute: 0.1 10*3/uL (ref 0.0–0.5)
Eosinophils Relative: 1 %
HCT: 37.8 % (ref 36.0–46.0)
Hemoglobin: 12.1 g/dL (ref 12.0–15.0)
Immature Granulocytes: 0 %
Lymphocytes Relative: 19 %
Lymphs Abs: 1.6 10*3/uL (ref 0.7–4.0)
MCH: 26.2 pg (ref 26.0–34.0)
MCHC: 32 g/dL (ref 30.0–36.0)
MCV: 82 fL (ref 80.0–100.0)
Monocytes Absolute: 0.6 10*3/uL (ref 0.1–1.0)
Monocytes Relative: 7 %
Neutro Abs: 5.8 10*3/uL (ref 1.7–7.7)
Neutrophils Relative %: 73 %
Platelet Count: 258 10*3/uL (ref 150–400)
RBC: 4.61 MIL/uL (ref 3.87–5.11)
RDW: 14 % (ref 11.5–15.5)
WBC Count: 8.1 10*3/uL (ref 4.0–10.5)
nRBC: 0 % (ref 0.0–0.2)

## 2022-12-21 LAB — LACTATE DEHYDROGENASE: LDH: 136 U/L (ref 98–192)

## 2022-12-21 NOTE — Progress Notes (Signed)
Hematology and Oncology Follow Up Visit  Heather Lloyd [id number] May 17, 1991 32 y.o. 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 She is doing fairly well. She has had some issues with blood on her tissue when blowing her nose. She will try blowing lightly and is this becomes more frequent or heavier we will get her over to see ENT. She will let us know.  Her cycle is regular with heavy flow. No clots noted. No other blood loss noted.  No abnormal bruising or petechiae.  She has episodes of nausea in the morning since having her son. No vomiting so far.  No fever, chills, cough, rash, dizziness, SOB, chest pain, abdominal pain or changes in bowel or bladder habits.  She has occasional palpitations that come and go.  No swelling, tenderness, numbness or tingling in her extremities.  No falls or syncope reported.  Appetite and hydration are good. Weight is stable at 267 lbs.   ECOG Performance Status: 1 - Symptomatic but completely ambulatory  Medications:  Allergies as of 12/21/2022       Reactions   Codeine Hives, Swelling   Ibuprofen Other (See Comments)   Pt is on blood thinners        Medication List        Accurate as of December 21, 2022  1:26 PM. If you have any questions, ask your nurse or doctor.          apixaban 5 MG Tabs tablet Commonly known as: Eliquis Take 1 tablet (5 mg total) by mouth 2 (two) times daily.   cyclobenzaprine 10 MG tablet Commonly known as: FLEXERIL Take 10 mg by mouth 3 (three) times daily as needed for muscle spasms (period pain).   dicyclomine 10 MG capsule Commonly known as: BENTYL Take 1 capsule (10 mg  total) by mouth 3 (three) times daily before meals.   polyethylene glycol 17 g packet Commonly known as: MIRALAX / GLYCOLAX Take 17 g by mouth daily as needed.   prenatal multivitamin Tabs tablet Take 1 tablet by mouth daily at 12 noon.        Allergies:  Allergies  Allergen Reactions   Codeine Hives and Swelling   Ibuprofen Other (See Comments)    Pt is on blood thinners     Past Medical History, Surgical history, Social history, and Family History were reviewed and updated.  Review of Systems: All other 10 point review of systems is negative.   Physical Exam:  vitals were not taken for this visit.   Wt Readings from Last 3 Encounters:  11/02/22 272 lb (123.4 kg)  10/18/22 277 lb 6.4 oz (125.8 kg)  07/19/22 279 lb 6.4 oz (126.7 kg)    Ocular: Sclerae unicteric, pupils equal, round and reactive to light Ear-nose-throat: Oropharynx clear, dentition fair Lymphatic: No cervical or supraclavicular adenopathy Lungs no rales or rhonchi, good excursion bilaterally Heart regular rate and rhythm, no murmur appreciated Abd soft, nontender, positive bowel sounds MSK no focal spinal tenderness, no joint edema Neuro: non-focal, well-oriented, appropriate affect Breasts: Deferred   Lab Results  Component Value Date   WBC 7.2 11/02/2022   HGB 12.7  11/02/2022   HCT 39.2 11/02/2022   MCV 81.8 11/02/2022   PLT 279 11/02/2022   No results found for: "FERRITIN", "IRON", "TIBC", "UIBC", "IRONPCTSAT" Lab Results  Component Value Date   RETICCTPCT 2.24 (H) 04/25/2017   RBC 4.79 11/02/2022   RETICCTABS 105.73 (H) 04/25/2017   No results found for: "KPAFRELGTCHN", "LAMBDASER", "KAPLAMBRATIO" No results found for: "IGGSERUM", "IGA", "IGMSERUM" No results found for: "TOTALPROTELP", "ALBUMINELP", "A1GS", "A2GS", "BETS", "BETA2SER", "GAMS", "MSPIKE", "SPEI"   Chemistry      Component Value Date/Time   NA 140 11/02/2022 0953   NA 140 04/25/2017 1550   K 4.1 11/02/2022 0953   K  3.8 04/25/2017 1550   CL 106 11/02/2022 0953   CO2 25 11/02/2022 0953   CO2 26 04/25/2017 1550   BUN 12 11/02/2022 0953   BUN 12.5 04/25/2017 1550   CREATININE 0.92 11/02/2022 0953   CREATININE 1.0 04/25/2017 1550      Component Value Date/Time   CALCIUM 9.4 11/02/2022 0953   CALCIUM 9.6 04/25/2017 1550   ALKPHOS 63 11/02/2022 0953   ALKPHOS 60 04/25/2017 1550   AST 29 11/02/2022 0953   AST 23 04/25/2017 1550   ALT 48 (H) 11/02/2022 0953   ALT 28 04/25/2017 1550   BILITOT 0.5 11/02/2022 0953   BILITOT 0.48 04/25/2017 1550       Impression and Plan: Heather Lloyd is a very pleasant 32 yo caucasian female with history of left lower extremity DVT and bilateral pulmonary emboli, factor V Leiden mutation-heterozygous, antithrombin III deficiency and 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    She will continue her same regimen with Eliquis 5 mg PO BID.  Follow-up in 6 months.   Lottie Dawson, NP 2/9/20241:26 PM

## 2023-01-17 ENCOUNTER — Inpatient Hospital Stay: Payer: Self-pay | Attending: Hematology & Oncology

## 2023-01-17 ENCOUNTER — Inpatient Hospital Stay: Payer: Self-pay | Admitting: Hematology & Oncology

## 2023-03-24 ENCOUNTER — Other Ambulatory Visit: Payer: Self-pay | Admitting: Hematology & Oncology

## 2023-04-18 ENCOUNTER — Other Ambulatory Visit: Payer: Self-pay | Admitting: Hematology & Oncology

## 2023-05-20 ENCOUNTER — Other Ambulatory Visit: Payer: Self-pay | Admitting: Hematology & Oncology

## 2023-05-23 DIAGNOSIS — Z7901 Long term (current) use of anticoagulants: Secondary | ICD-10-CM | POA: Diagnosis not present

## 2023-05-23 DIAGNOSIS — D6851 Activated protein C resistance: Secondary | ICD-10-CM | POA: Diagnosis not present

## 2023-05-23 DIAGNOSIS — D6859 Other primary thrombophilia: Secondary | ICD-10-CM | POA: Diagnosis not present

## 2023-05-23 DIAGNOSIS — Z86711 Personal history of pulmonary embolism: Secondary | ICD-10-CM | POA: Diagnosis not present

## 2023-05-23 DIAGNOSIS — Z0000 Encounter for general adult medical examination without abnormal findings: Secondary | ICD-10-CM | POA: Diagnosis not present

## 2023-05-23 DIAGNOSIS — E669 Obesity, unspecified: Secondary | ICD-10-CM | POA: Diagnosis not present

## 2023-06-17 ENCOUNTER — Other Ambulatory Visit: Payer: Self-pay | Admitting: Hematology & Oncology

## 2023-06-21 ENCOUNTER — Inpatient Hospital Stay: Payer: Self-pay | Admitting: Family

## 2023-06-21 ENCOUNTER — Inpatient Hospital Stay: Payer: Self-pay

## 2023-07-15 ENCOUNTER — Other Ambulatory Visit: Payer: Self-pay | Admitting: Hematology & Oncology

## 2023-08-13 ENCOUNTER — Other Ambulatory Visit: Payer: Self-pay | Admitting: Hematology & Oncology

## 2023-09-11 ENCOUNTER — Other Ambulatory Visit: Payer: Self-pay | Admitting: Hematology & Oncology

## 2023-10-12 ENCOUNTER — Other Ambulatory Visit: Payer: Self-pay | Admitting: Hematology & Oncology

## 2023-11-08 ENCOUNTER — Other Ambulatory Visit: Payer: Self-pay

## 2023-11-08 ENCOUNTER — Telehealth: Payer: Self-pay

## 2023-11-08 DIAGNOSIS — Z3689 Encounter for other specified antenatal screening: Secondary | ICD-10-CM | POA: Diagnosis not present

## 2023-11-08 DIAGNOSIS — Z86718 Personal history of other venous thrombosis and embolism: Secondary | ICD-10-CM

## 2023-11-08 DIAGNOSIS — D6851 Activated protein C resistance: Secondary | ICD-10-CM

## 2023-11-08 MED ORDER — ENOXAPARIN SODIUM 120 MG/0.8ML IJ SOSY
120.0000 mg | PREFILLED_SYRINGE | Freq: Two times a day (BID) | INTRAMUSCULAR | 0 refills | Status: DC
Start: 2023-11-08 — End: 2023-11-19

## 2023-11-08 MED ORDER — ENOXAPARIN SODIUM 120 MG/0.8ML IJ SOSY
120.0000 mg | PREFILLED_SYRINGE | Freq: Two times a day (BID) | INTRAMUSCULAR | 0 refills | Status: DC
Start: 2023-11-08 — End: 2023-11-08

## 2023-11-08 NOTE — Telephone Encounter (Signed)
Received call from patient reporting that she had a positive home pregnancy test this am. Is scheduled to have lab confirmation at [facility] today. Questions if she needs to restart Lovenox and/or be evaluated in our office.   Per Dr Myna Hidalgo, Lovenox script called in. Pt to see either midlevel in our office one day next week. Pt aware and verbalizes understanding. Scheduling notified. dph

## 2023-11-13 NOTE — L&D Delivery Note (Signed)
 Delivery Note At 5:46 PM a viable female was delivered via Vaginal, Spontaneous (Presentation: Left Occiput Anterior).  APGAR: 8, 9; weight 8 lb 2.2 oz (3690 g).   Placenta status: Spontaneous, Intact.  Cord: nuchal x 1 reduced, loose. 3 vessels with the following complications: None.  Cord pH: n/a  Anesthesia: None Episiotomy: None Lacerations: None Suture Repair: n/a Est. Blood Loss (mL): 5  Mom to postpartum.  Baby to Couplet care / Skin to Skin.  Heather Lloyd 07/14/2024, 6:20 PM

## 2023-11-14 ENCOUNTER — Encounter: Payer: Self-pay | Admitting: Medical Oncology

## 2023-11-14 ENCOUNTER — Inpatient Hospital Stay: Payer: 59 | Attending: Hematology & Oncology

## 2023-11-14 ENCOUNTER — Inpatient Hospital Stay (HOSPITAL_BASED_OUTPATIENT_CLINIC_OR_DEPARTMENT_OTHER): Payer: 59 | Admitting: Medical Oncology

## 2023-11-14 VITALS — BP 108/68 | HR 75 | Temp 98.3°F | Resp 18 | Ht 70.0 in | Wt 268.8 lb

## 2023-11-14 DIAGNOSIS — Z3A01 Less than 8 weeks gestation of pregnancy: Secondary | ICD-10-CM | POA: Insufficient documentation

## 2023-11-14 DIAGNOSIS — G8929 Other chronic pain: Secondary | ICD-10-CM

## 2023-11-14 DIAGNOSIS — O99111 Other diseases of the blood and blood-forming organs and certain disorders involving the immune mechanism complicating pregnancy, first trimester: Secondary | ICD-10-CM | POA: Diagnosis not present

## 2023-11-14 DIAGNOSIS — R160 Hepatomegaly, not elsewhere classified: Secondary | ICD-10-CM

## 2023-11-14 DIAGNOSIS — I82512 Chronic embolism and thrombosis of left femoral vein: Secondary | ICD-10-CM

## 2023-11-14 DIAGNOSIS — Z86718 Personal history of other venous thrombosis and embolism: Secondary | ICD-10-CM | POA: Insufficient documentation

## 2023-11-14 DIAGNOSIS — D6851 Activated protein C resistance: Secondary | ICD-10-CM | POA: Insufficient documentation

## 2023-11-14 DIAGNOSIS — Z7901 Long term (current) use of anticoagulants: Secondary | ICD-10-CM | POA: Diagnosis not present

## 2023-11-14 DIAGNOSIS — Z86711 Personal history of pulmonary embolism: Secondary | ICD-10-CM

## 2023-11-14 LAB — CMP (CANCER CENTER ONLY)
ALT: 24 U/L (ref 0–44)
AST: 21 U/L (ref 15–41)
Albumin: 4.4 g/dL (ref 3.5–5.0)
Alkaline Phosphatase: 42 U/L (ref 38–126)
Anion gap: 9 (ref 5–15)
BUN: 13 mg/dL (ref 6–20)
CO2: 22 mmol/L (ref 22–32)
Calcium: 9.1 mg/dL (ref 8.9–10.3)
Chloride: 107 mmol/L (ref 98–111)
Creatinine: 0.82 mg/dL (ref 0.44–1.00)
GFR, Estimated: >60 mL/min (ref >60)
Glucose, Bld: 104 mg/dL — ABNORMAL HIGH (ref 70–99)
Potassium: 3.9 mmol/L (ref 3.5–5.1)
Sodium: 138 mmol/L (ref 135–145)
Total Bilirubin: 0.5 mg/dL (ref 0.0–1.2)
Total Protein: 7.5 g/dL (ref 6.5–8.1)

## 2023-11-14 LAB — CBC WITH DIFFERENTIAL (CANCER CENTER ONLY)
Abs Immature Granulocytes: 0.05 10*3/uL (ref 0.00–0.07)
Basophils Absolute: 0 10*3/uL (ref 0.0–0.1)
Basophils Relative: 0 %
Eosinophils Absolute: 0.1 10*3/uL (ref 0.0–0.5)
Eosinophils Relative: 2 %
HCT: 36.9 % (ref 36.0–46.0)
Hemoglobin: 12.2 g/dL (ref 12.0–15.0)
Immature Granulocytes: 1 %
Lymphocytes Relative: 30 %
Lymphs Abs: 2.4 10*3/uL (ref 0.7–4.0)
MCH: 27.2 pg (ref 26.0–34.0)
MCHC: 33.1 g/dL (ref 30.0–36.0)
MCV: 82.2 fL (ref 80.0–100.0)
Monocytes Absolute: 0.6 10*3/uL (ref 0.1–1.0)
Monocytes Relative: 8 %
Neutro Abs: 4.8 10*3/uL (ref 1.7–7.7)
Neutrophils Relative %: 59 %
Platelet Count: 245 10*3/uL (ref 150–400)
RBC: 4.49 MIL/uL (ref 3.87–5.11)
RDW: 14.4 % (ref 11.5–15.5)
WBC Count: 8 10*3/uL (ref 4.0–10.5)
nRBC: 0 % (ref 0.0–0.2)

## 2023-11-14 LAB — LACTATE DEHYDROGENASE: LDH: 130 U/L (ref 98–192)

## 2023-11-14 NOTE — Progress Notes (Signed)
 Hematology and Oncology Follow Up Visit  Heather Lloyd [id number] Sep 07, 1991 32 y.o. 11/14/2023   Principle Diagnosis:  History of left lower extremity DVT and bilateral pulmonary emboli Factor V Leiden mutation-heterozygous Antithrombin III  deficiency May-Thurner syndrome   Current Therapy:        Lovenox  120 mg SQ BID - pregnant, suspected to be 1 month pregnant   Past Therapy: Eliquis  5 mg PO BID -- started on 02/26/2022- d/c on 11/08/2023 due to pregnancy    Interim History:  Heather Lloyd is here today for follow-up.   She is currently roughly [redacted] weeks pregnant.  No clots noted. No other blood loss noted.  No abnormal bruising or petechiae.  No fever, chills, cough, rash, dizziness, SOB, chest pain, abdominal pain or changes in bowel or bladder habits.  She has had some nausea and occasional dizziness  She has occasional palpitations that come and go.  No swelling, tenderness, numbness or tingling in her extremities.  No falls or syncope reported.  Appetite and hydration are good.   ECOG Performance Status: 1 - Symptomatic but completely ambulatory  Medications:  Allergies as of 11/14/2023       Reactions   Codeine Hives, Swelling   Ibuprofen Other (See Comments)   Pt is on blood thinners        Medication List        Accurate as of November 14, 2023 11:43 AM. If you have any questions, ask your nurse or doctor.          STOP taking these medications    dicyclomine  10 MG capsule Commonly known as: BENTYL  Stopped by: Lauraine CHRISTELLA Dais   Eliquis  5 MG Tabs tablet Generic drug: apixaban  Stopped by: Lauraine CHRISTELLA Dais       TAKE these medications    cyclobenzaprine 10 MG tablet Commonly known as: FLEXERIL Take 10 mg by mouth 3 (three) times daily as needed for muscle spasms (period pain).   enoxaparin  120 MG/0.8ML injection Commonly known as: LOVENOX  Inject 0.8 mLs (120 mg total) into the skin every 12 (twelve) hours.   polyethylene glycol 17 g  packet Commonly known as: MIRALAX / GLYCOLAX Take 17 g by mouth daily as needed.   prenatal multivitamin Tabs tablet Take 1 tablet by mouth daily at 12 noon.        Allergies:  Allergies  Allergen Reactions   Codeine Hives and Swelling   Ibuprofen Other (See Comments)    Pt is on blood thinners     Past Medical History, Surgical history, Social history, and Family History were reviewed and updated.  Review of Systems: All other 10 point review of systems is negative.   Physical Exam:  height is 5' 10 (1.778 m) and weight is 268 lb 12.8 oz (121.9 kg). Her oral temperature is 98.3 F (36.8 C). Her blood pressure is 108/68 and her pulse is 75. Her respiration is 18 and oxygen saturation is 100%.   Wt Readings from Last 3 Encounters:  11/14/23 268 lb 12.8 oz (121.9 kg)  12/21/22 267 lb 1.6 oz (121.2 kg)  11/02/22 272 lb (123.4 kg)    Ocular: Sclerae unicteric, pupils equal, round and reactive to light Ear-nose-throat: Oropharynx clear, dentition fair Lymphatic: No cervical or supraclavicular adenopathy Lungs no rales or rhonchi, good excursion bilaterally Heart regular rate and rhythm, no murmur appreciated Abd soft, nontender, positive bowel sounds MSK no focal spinal tenderness, no joint edema Neuro: non-focal, well-oriented, appropriate affect   Lab Results  Component Value Date   WBC 8.0 11/14/2023   HGB 12.2 11/14/2023   HCT 36.9 11/14/2023   MCV 82.2 11/14/2023   PLT 245 11/14/2023   No results found for: FERRITIN, IRON, TIBC, UIBC, IRONPCTSAT Lab Results  Component Value Date   RETICCTPCT 2.24 (H) 04/25/2017   RBC 4.49 11/14/2023   RETICCTABS 105.73 (H) 04/25/2017   No results found for: KPAFRELGTCHN, LAMBDASER, KAPLAMBRATIO No results found for: IGGSERUM, IGA, IGMSERUM No results found for: STEPHANY CARLOTA BENSON MARKEL EARLA JOANNIE DOC VICK, SPEI   Chemistry      Component Value Date/Time    NA 139 12/21/2022 1316   NA 140 04/25/2017 1550   K 4.0 12/21/2022 1316   K 3.8 04/25/2017 1550   CL 108 12/21/2022 1316   CO2 22 12/21/2022 1316   CO2 26 04/25/2017 1550   BUN 17 12/21/2022 1316   BUN 12.5 04/25/2017 1550   CREATININE 0.78 12/21/2022 1316   CREATININE 1.0 04/25/2017 1550      Component Value Date/Time   CALCIUM 9.0 12/21/2022 1316   CALCIUM 9.6 04/25/2017 1550   ALKPHOS 53 12/21/2022 1316   ALKPHOS 60 04/25/2017 1550   AST 20 12/21/2022 1316   AST 23 04/25/2017 1550   ALT 29 12/21/2022 1316   ALT 28 04/25/2017 1550   BILITOT 0.4 12/21/2022 1316   BILITOT 0.48 04/25/2017 1550     Encounter Diagnoses  Name Primary?   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  She is currently on Lovenox  as she is newly pregnant. Her visit with OB-GYN is on Jan 22nd.   Today CBC is normal. CMP is stable and non-concerning  No sign of thrombus recurrence   She will continue her current Lovenox  regimen   RTC 6 months APP (Virtual per pt request)- will have labs with OB-GYN  Lauraine CHRISTELLA Dais, PA-C 1/2/202511:43 AM

## 2023-11-18 ENCOUNTER — Other Ambulatory Visit: Payer: Self-pay | Admitting: Hematology & Oncology

## 2023-11-18 DIAGNOSIS — Z86718 Personal history of other venous thrombosis and embolism: Secondary | ICD-10-CM

## 2023-11-18 DIAGNOSIS — D6851 Activated protein C resistance: Secondary | ICD-10-CM

## 2023-12-04 ENCOUNTER — Other Ambulatory Visit: Payer: Self-pay | Admitting: Hematology & Oncology

## 2023-12-04 DIAGNOSIS — D6851 Activated protein C resistance: Secondary | ICD-10-CM

## 2023-12-04 DIAGNOSIS — Z3201 Encounter for pregnancy test, result positive: Secondary | ICD-10-CM | POA: Diagnosis not present

## 2023-12-04 DIAGNOSIS — Z86718 Personal history of other venous thrombosis and embolism: Secondary | ICD-10-CM

## 2023-12-18 ENCOUNTER — Other Ambulatory Visit: Payer: Self-pay | Admitting: Hematology & Oncology

## 2023-12-18 DIAGNOSIS — Z86718 Personal history of other venous thrombosis and embolism: Secondary | ICD-10-CM

## 2023-12-18 DIAGNOSIS — D6851 Activated protein C resistance: Secondary | ICD-10-CM

## 2023-12-30 DIAGNOSIS — Z3481 Encounter for supervision of other normal pregnancy, first trimester: Secondary | ICD-10-CM | POA: Diagnosis not present

## 2023-12-30 DIAGNOSIS — Z3689 Encounter for other specified antenatal screening: Secondary | ICD-10-CM | POA: Diagnosis not present

## 2023-12-30 LAB — HEPATITIS C ANTIBODY: HCV Ab: NEGATIVE

## 2023-12-30 LAB — OB RESULTS CONSOLE HEPATITIS B SURFACE ANTIGEN: Hepatitis B Surface Ag: NEGATIVE

## 2023-12-30 LAB — OB RESULTS CONSOLE HIV ANTIBODY (ROUTINE TESTING): HIV: NONREACTIVE

## 2023-12-30 LAB — OB RESULTS CONSOLE GC/CHLAMYDIA
Chlamydia: NEGATIVE
Neisseria Gonorrhea: NEGATIVE

## 2023-12-30 LAB — OB RESULTS CONSOLE RPR: RPR: NONREACTIVE

## 2023-12-30 LAB — OB RESULTS CONSOLE RUBELLA ANTIBODY, IGM: Rubella: IMMUNE

## 2024-01-06 ENCOUNTER — Other Ambulatory Visit: Payer: Self-pay | Admitting: Hematology & Oncology

## 2024-01-06 DIAGNOSIS — D6851 Activated protein C resistance: Secondary | ICD-10-CM

## 2024-01-06 DIAGNOSIS — Z86718 Personal history of other venous thrombosis and embolism: Secondary | ICD-10-CM

## 2024-01-21 ENCOUNTER — Other Ambulatory Visit: Payer: Self-pay | Admitting: Hematology & Oncology

## 2024-01-21 DIAGNOSIS — Z86718 Personal history of other venous thrombosis and embolism: Secondary | ICD-10-CM

## 2024-01-21 DIAGNOSIS — D6851 Activated protein C resistance: Secondary | ICD-10-CM

## 2024-01-27 DIAGNOSIS — Z361 Encounter for antenatal screening for raised alphafetoprotein level: Secondary | ICD-10-CM | POA: Diagnosis not present

## 2024-01-27 DIAGNOSIS — Z3A15 15 weeks gestation of pregnancy: Secondary | ICD-10-CM | POA: Diagnosis not present

## 2024-01-27 DIAGNOSIS — R5383 Other fatigue: Secondary | ICD-10-CM | POA: Diagnosis not present

## 2024-02-04 ENCOUNTER — Other Ambulatory Visit: Payer: Self-pay

## 2024-02-04 DIAGNOSIS — D6851 Activated protein C resistance: Secondary | ICD-10-CM

## 2024-02-04 DIAGNOSIS — Z86718 Personal history of other venous thrombosis and embolism: Secondary | ICD-10-CM

## 2024-02-04 MED ORDER — ENOXAPARIN SODIUM 120 MG/0.8ML IJ SOSY: 120.0000 mg | PREFILLED_SYRINGE | Freq: Two times a day (BID) | INTRAMUSCULAR | 0 refills | Status: DC

## 2024-02-05 ENCOUNTER — Telehealth: Payer: Self-pay

## 2024-02-05 DIAGNOSIS — D6851 Activated protein C resistance: Secondary | ICD-10-CM

## 2024-02-05 DIAGNOSIS — Z86718 Personal history of other venous thrombosis and embolism: Secondary | ICD-10-CM

## 2024-02-05 MED ORDER — ENOXAPARIN SODIUM 120 MG/0.8ML IJ SOSY: 120.0000 mg | PREFILLED_SYRINGE | Freq: Two times a day (BID) | INTRAMUSCULAR | 0 refills | Status: DC

## 2024-02-05 NOTE — Telephone Encounter (Signed)
 Received a fax from [employer] stating that the quantity of 30 mL will not last 30 days if she is getting 0.8 mLs BID. Spoke with pharmacy to confirm that pt needs 48 mL for a 30 day Rx. New prescription sent.

## 2024-03-02 DIAGNOSIS — Z3A20 20 weeks gestation of pregnancy: Secondary | ICD-10-CM | POA: Diagnosis not present

## 2024-03-02 DIAGNOSIS — Z363 Encounter for antenatal screening for malformations: Secondary | ICD-10-CM | POA: Diagnosis not present

## 2024-03-03 ENCOUNTER — Other Ambulatory Visit: Payer: Self-pay | Admitting: Family

## 2024-03-03 DIAGNOSIS — Z86718 Personal history of other venous thrombosis and embolism: Secondary | ICD-10-CM

## 2024-03-03 DIAGNOSIS — D6851 Activated protein C resistance: Secondary | ICD-10-CM

## 2024-03-31 ENCOUNTER — Other Ambulatory Visit: Payer: Self-pay | Admitting: Hematology & Oncology

## 2024-03-31 DIAGNOSIS — Z86718 Personal history of other venous thrombosis and embolism: Secondary | ICD-10-CM

## 2024-03-31 DIAGNOSIS — D6851 Activated protein C resistance: Secondary | ICD-10-CM

## 2024-05-01 ENCOUNTER — Other Ambulatory Visit: Payer: Self-pay | Admitting: Hematology & Oncology

## 2024-05-01 DIAGNOSIS — Z86718 Personal history of other venous thrombosis and embolism: Secondary | ICD-10-CM

## 2024-05-01 DIAGNOSIS — Z3689 Encounter for other specified antenatal screening: Secondary | ICD-10-CM | POA: Diagnosis not present

## 2024-05-01 DIAGNOSIS — D6851 Activated protein C resistance: Secondary | ICD-10-CM

## 2024-05-11 DIAGNOSIS — Z23 Encounter for immunization: Secondary | ICD-10-CM | POA: Diagnosis not present

## 2024-05-13 ENCOUNTER — Inpatient Hospital Stay: Payer: 59 | Attending: Medical Oncology | Admitting: Medical Oncology

## 2024-05-13 DIAGNOSIS — D6851 Activated protein C resistance: Secondary | ICD-10-CM | POA: Diagnosis not present

## 2024-05-13 DIAGNOSIS — Z86718 Personal history of other venous thrombosis and embolism: Secondary | ICD-10-CM

## 2024-05-13 NOTE — Progress Notes (Signed)
 Virtual Visit Progress Note  Heather Lloyd,you are scheduled for a virtual visit with your provider today.    Just as we do with appointments in the office, we must obtain your consent to participate.  Your consent will be active for this visit and any virtual visit you may have with one of our providers in the next 365 days.    If you have a MyChart account, I can also send a copy of this consent to you electronically.  All virtual visits are billed to your insurance company just like a traditional visit in the office.  As this is a virtual visit, video technology does not allow for your provider to perform a traditional examination.  This may limit your provider's ability to fully assess your condition.  If your provider identifies any concerns that need to be evaluated in person or the need to arrange testing such as labs, EKG, etc, we will make arrangements to do so.    Although advances in technology are sophisticated, we cannot ensure that it will always work on either your end or our end.  If the connection with a video visit is poor, we may have to switch to a telephone visit.  With either a video or telephone visit, we are not always able to ensure that we have a secure connection.   I need to obtain your verbal consent now.   Are you willing to proceed with your visit today?   Batool M Dziedzic has provided verbal consent on 05/13/2024 for a virtual visit (video or telephone).   Lauraine CHRISTELLA Dais, PA-C 05/13/2024  11:21 AM    I connected with Jon CHRISTELLA Golds on 05/13/24 at 11:00 AM EDT by video enabled telemedicine visit and verified that I am speaking with the correct person using two identifiers.   I discussed the limitations, risks, security and privacy concerns of performing an evaluation and management service by telemedicine and the availability of in-person appointments. I also discussed with the patient that there may be a patient responsible charge related to this service. The  patient expressed understanding and agreed to proceed.   Other persons participating in the visit and their role in the encounter: None  Patient's location: [place] Provider's location: Clinic     Patient Care Team: Rexanne Ingle, MD as PCP - General (Internal Medicine) Patient, No Pcp Per (General Practice)   Heather Lloyd [id number] Aug 20, 1991 32 y.o. 11/14/2023     Principle Diagnosis:  History of left lower extremity DVT and bilateral pulmonary emboli Factor V Leiden mutation-heterozygous Antithrombin III  deficiency May-Thurner syndrome   Current Therapy:        Lovenox  120 mg SQ BID - she is [redacted] weeks pregnant    Past Therapy: Eliquis  5 mg PO BID -- started on 02/26/2022- d/c on 11/08/2023 due to pregnancy               Interim History:  [name] is here today for follow-up.    She is currently [redacted] weeks pregnant. Little girl is growing well. They are planning on a vaginal birth.  She has noticed that she has lost weight when coming off of the Eliquis  with both pregnancies. She asks if we can consider switching to another medication when she stops breast feeding. She has done well with coumadin  in the past. No clotting events while on medication.  No clots noted. No other blood loss noted.  No abnormal bruising or petechiae.  No fever, chills, cough,  rash, dizziness, SOB, chest pain, abdominal pain or changes in bowel or bladder habits.  She has had some nausea and occasional dizziness  She has occasional palpitations that come and go.  No swelling, tenderness, numbness or tingling in her extremities.  No falls or syncope reported.  Appetite and hydration are good.    ECOG Performance Status: 1 - Symptomatic but completely ambulatory  Review of systems- ROS   Allergies  Allergen Reactions   Codeine Hives and Swelling   Ibuprofen Other (See Comments)    Pt is on blood thinners     Past Medical History:  Diagnosis Date   Antithrombin III  deficiency (HCC)     Constipation    mild   Diverticulitis    no current problem   DVT (deep venous thrombosis) (HCC) 2009   [place]  - femoral vein of left lower extremity   Factor V Leiden (HCC)    [facility] blood transfusion 2009   [facility]   May-Thurner syndrome    Ovarian cyst    Pulmonary embolism (HCC) 2009   surgery to remove clot from left lung    Past Surgical History:  Procedure Laterality Date   ADENOIDECTOMY     LAPAROSCOPIC OVARIAN CYSTECTOMY Right 05/09/2018   Procedure: LAPAROSCOPIC OVARIAN CYSTECTOMY;  Surgeon: Ozan, Jennifer, DO;  Location: [facility];  Service: Gynecology;  Laterality: Right;   OOPHORECTOMY Right 05/09/2018   Procedure: OOPHORECTOMY;  Surgeon: Marilynn Nest, DO;  Location: [facility];  Service: Gynecology;  Laterality: Right;   THROMBECTOMY  2009   10 yrs ago in [place]    TONSILLECTOMY AND ADENOIDECTOMY     WISDOM TOOTH EXTRACTION      Social History   Socioeconomic History   Marital status: Married    Spouse name: Not on file   Number of children: Not on file   Years of education: Not on file   Highest education level: Not on file  Occupational History   Not on file  Tobacco Use   Smoking status: Never   Smokeless tobacco: Never  Vaping Use   Vaping status: Never Used  Substance and Sexual Activity   Alcohol use: Not Currently    Alcohol/week: 3.0 standard drinks of alcohol    Types: 2 Glasses of wine, 1 Shots of liquor per week    Comment: not while preg   Drug use: No   Sexual activity: Yes  Other Topics Concern   Not on file  Social History Narrative   Not on file   Social Drivers of Health   Financial Resource Strain: Not on file  Food Insecurity: Not on file  Transportation Needs: Not on file  Physical Activity: Not on file  Stress: Not on file  Social Connections: Not on file  Intimate Partner Violence: Not on file    There is no immunization history for the selected administration types on file for this  patient.  Family History  Problem Relation Age of Onset   Factor V Leiden deficiency Mother    Diabetes Maternal Grandmother    Diabetes Maternal Grandfather    Hypertension Maternal Grandfather      Current Outpatient Medications:    cyclobenzaprine (FLEXERIL) 10 MG tablet, Take 10 mg by mouth 3 (three) times daily as needed for muscle spasms (period pain). (Patient not taking: Reported on 11/14/2023), Disp: , Rfl:    enoxaparin  (LOVENOX ) 120 MG/0.8ML injection, INJECT 0.8ML UNDER THE SKIN EVERY 12 HOURS, Disp: 48 mL, Rfl: 0  polyethylene glycol (MIRALAX / GLYCOLAX) 17 g packet, Take 17 g by mouth daily as needed., Disp: , Rfl:    Prenatal Vit-Fe Fumarate-FA (PRENATAL MULTIVITAMIN) TABS tablet, Take 1 tablet by mouth daily at 12 noon., Disp: , Rfl:   Physical exam: Exam limited due to telemedicine Physical Exam Constitutional:      General: She is not in acute distress.    Appearance: Normal appearance. She is not ill-appearing, toxic-appearing or diaphoretic.  Neurological:     Mental Status: She is alert.     Assessment and plan- Patient is a 33 y.o. 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  She is currently on Lovenox  due to pregnancy.   We discussed the transition for her Lovenox  to heparin  ahead of her induction.  We also discussed her concerns regarding weight gain on Eliquis . We will plan to have her on the Lovenox  for the foreseeable future but when she stops breastfeeding we may trial it again. If she noticed weight gain we will stop and switch her onto coumadin  or Xarelto (which she has been on in the past with no troubles). She is planning on breast feeding for about 1 year.   RTC APP, labs (CBC, CMP) in 6 months    Visit Diagnosis 1. Factor V Leiden mutation (HCC)   2. History of deep venous  thrombosis (DVT) of distal vein of left lower extremity     Patient expressed understanding and was in agreement with this plan. She also understands that She can call clinic at any time with any questions, concerns, or complaints.   I discussed the assessment and treatment plan with the patient. The patient was provided an opportunity to ask questions and all were answered. The patient agreed with the plan and demonstrated an understanding of the instructions.   The patient was advised to call back or seek an in-person evaluation if the symptoms worsen or if the condition fails to improve as anticipated.   I spent 20 minutes face-to-face video visit time dedicated to the care of this patient on the date of this encounter to include the discussions above.   Thank you for allowing me to participate in the care of this very pleasant patient.   Haizley Cannella PA-C

## 2024-05-25 DIAGNOSIS — O99213 Obesity complicating pregnancy, third trimester: Secondary | ICD-10-CM | POA: Diagnosis not present

## 2024-05-25 DIAGNOSIS — Z3A32 32 weeks gestation of pregnancy: Secondary | ICD-10-CM | POA: Diagnosis not present

## 2024-05-29 ENCOUNTER — Other Ambulatory Visit: Payer: Self-pay | Admitting: Hematology & Oncology

## 2024-05-29 DIAGNOSIS — D6851 Activated protein C resistance: Secondary | ICD-10-CM

## 2024-05-29 DIAGNOSIS — Z86718 Personal history of other venous thrombosis and embolism: Secondary | ICD-10-CM

## 2024-06-18 ENCOUNTER — Encounter: Payer: Self-pay | Admitting: Family

## 2024-06-21 DIAGNOSIS — Z3482 Encounter for supervision of other normal pregnancy, second trimester: Secondary | ICD-10-CM | POA: Diagnosis not present

## 2024-06-21 DIAGNOSIS — Z3483 Encounter for supervision of other normal pregnancy, third trimester: Secondary | ICD-10-CM | POA: Diagnosis not present

## 2024-06-22 DIAGNOSIS — Z3685 Encounter for antenatal screening for Streptococcus B: Secondary | ICD-10-CM | POA: Diagnosis not present

## 2024-06-22 LAB — OB RESULTS CONSOLE GBS: GBS: NEGATIVE

## 2024-06-24 ENCOUNTER — Other Ambulatory Visit: Payer: Self-pay | Admitting: Medical Oncology

## 2024-06-24 MED ORDER — HEPARIN SODIUM (PORCINE) 10000 UNIT/ML IJ SOLN: 10000.0000 [IU] | Freq: Two times a day (BID) | INTRAMUSCULAR | 1 refills | Status: DC

## 2024-06-29 DIAGNOSIS — O99213 Obesity complicating pregnancy, third trimester: Secondary | ICD-10-CM | POA: Diagnosis not present

## 2024-06-29 DIAGNOSIS — Z3A37 37 weeks gestation of pregnancy: Secondary | ICD-10-CM | POA: Diagnosis not present

## 2024-07-06 DIAGNOSIS — D6859 Other primary thrombophilia: Secondary | ICD-10-CM | POA: Diagnosis not present

## 2024-07-06 DIAGNOSIS — Z3A38 38 weeks gestation of pregnancy: Secondary | ICD-10-CM | POA: Diagnosis not present

## 2024-07-06 DIAGNOSIS — Z7901 Long term (current) use of anticoagulants: Secondary | ICD-10-CM | POA: Diagnosis not present

## 2024-07-06 DIAGNOSIS — O99213 Obesity complicating pregnancy, third trimester: Secondary | ICD-10-CM | POA: Diagnosis not present

## 2024-07-06 DIAGNOSIS — Z3480 Encounter for supervision of other normal pregnancy, unspecified trimester: Secondary | ICD-10-CM | POA: Diagnosis not present

## 2024-07-09 ENCOUNTER — Telehealth (HOSPITAL_COMMUNITY): Payer: Self-pay | Admitting: *Deleted

## 2024-07-09 ENCOUNTER — Encounter (HOSPITAL_COMMUNITY): Payer: Self-pay | Admitting: *Deleted

## 2024-07-09 NOTE — Telephone Encounter (Signed)
 Preadmission screen

## 2024-07-10 ENCOUNTER — Other Ambulatory Visit: Payer: Self-pay | Admitting: Obstetrics & Gynecology

## 2024-07-13 ENCOUNTER — Encounter (HOSPITAL_COMMUNITY)

## 2024-07-13 NOTE — H&P (Incomplete)
 Heather Lloyd is a 33 y.o. female presenting for IOL in anticoagulation window  33 yo MF, G2P1001. 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 She is on Eliquis  when not pregnant, She is managed by Hematologist  Dr Timmy.  G1- SVD, IOL at 39 wks in anticaog window. Boy, 8'6 Feb'23 at [facility].  h/o right oophorectomy for large dermoid 2019. Dating by sono at 7.2 wks, [facility] 07/21/24.  NIPT low risk . Declined Horizon 14.  Anatomy nl  28 wks growth 3' 3 80% AC 94% AFI 22 cm Breech 36 wk sono- EFW 7'14 98% AC 99% BPD 61%, AFI 17 cm Vx. BPP 8/8 NST reactive.  Wkly NST reactive for BMI and thrombophilia  GBS neg   Plan per Hematologist - Heparin  switch from at 37 wks, 10 K bid. Needs to go back on Lovenox  postpartum at 4-6 hr, start at 30mg  every 12 hrs for 48 hrs, then to 120 mg q 12 hrs and switch to Xarelto after breast feeding.   . OB History     Gravida  2   Para  1   Term  1   Preterm      AB      Living  1      SAB      IAB      Ectopic      Multiple  0   Live Births  1          Past Medical History:  Diagnosis Date   Antithrombin III  deficiency (HCC)    Constipation    mild   Diverticulitis    no current problem   Diverticulitis    DVT (deep venous thrombosis) (HCC) 2009   [place]  - femoral vein of left lower extremity   Factor V Leiden (HCC)    History of blood transfusion 2009   [facility]   May-Thurner syndrome    Ovarian cyst    PCOS (polycystic ovarian syndrome)    Pulmonary embolism (HCC) 2009   surgery to remove clot from left lung   Umbilical hernia    Past Surgical History:  Procedure Laterality Date   ADENOIDECTOMY     LAPAROSCOPIC OVARIAN CYSTECTOMY Right 05/09/2018   Procedure: LAPAROSCOPIC OVARIAN CYSTECTOMY;  Surgeon: Ozan, Jennifer,  DO;  Location: [facility];  Service: Gynecology;  Laterality: Right;   OOPHORECTOMY Right 05/09/2018   Procedure: OOPHORECTOMY;  Surgeon: Marilynn Nest, DO;  Location: [facility];  Service: Gynecology;  Laterality: Right;   THROMBECTOMY  2009   10 yrs ago in [place]    TONSILLECTOMY AND ADENOIDECTOMY     WISDOM TOOTH EXTRACTION     Family History: family history includes Diabetes in her maternal grandfather and maternal grandmother; Factor V Leiden deficiency in her mother; Hypertension in her maternal grandfather. Social History:  reports that she has never smoked. She has never used smokeless tobacco. She reports that she does not currently use alcohol after a past usage of about 3.0 standard drinks of alcohol per week. She reports that she does not use drugs.     Maternal Diabetes: No Genetic Screening: Normal NIPT female Maternal Ultrasounds/Referrals: Normal Fetal Ultrasounds or other Referrals:  None Maternal Substance Abuse:  No Significant Maternal Medications:  Meds include: Other: Lovenox  120mg  daily throughout pregnancy, now Heparin  10 K units  bid  Significant Maternal Lab Results:  Group B Strep negative Number of Prenatal Visits:greater than 3 verified prenatal visits Maternal Vaccinations:TDap Other Comments:  None   Exam Physical Exam  BP 102/62   Pulse 94   Temp 97.7 F (36.5 C) (Oral)   Resp 16   Ht 5' 10 (1.778 m)   Wt 280 lb 3.3 oz (127.1 kg)   SpO2 97%   BMI 40.21 kg/m   A&O x 3, no acute distress. Pleasant HEENT neg, no thyromegaly Lungs CTA bilat CV RRR, S1S2 normal Abdo soft, non tender, non acute Extr no edema/ tenderness Pelvic  cx 1 cm per last office exam  FHT  145 + accels no decels  mod variab cat I Toco none  Prenatal labs: ABO, Rh:  A+ Antibody:  Neg Rubella: Immune (02/17 0000) RPR: Nonreactive (02/17 0000)  HBsAg: Negative (02/17 0000)  Hep C neg  HIV: Non-reactive (02/17 0000)  GBS: Negative/-- (08/11 0000)  Glucola nl  AFP1IPT  low risk    Assessment/Plan: 33 yo G2P1001  39.1 wks here for IOL in anticoagulation window.  Cytotec  25 mcg q 4 hours, light laboring diet until active labor. Plan cervical foley/ pitocin  as needed,  Early epidural, Anesthesia consult.  Compression socks at admission and SCDs after Epidural.  EFW 8.1/2- 9. Lbs (pelvic proven to 8'6)   Robbi JONELLE Render MD

## 2024-07-14 ENCOUNTER — Encounter (HOSPITAL_COMMUNITY): Payer: Self-pay | Admitting: Anesthesiology

## 2024-07-14 ENCOUNTER — Inpatient Hospital Stay (HOSPITAL_COMMUNITY)
Admission: RE | Admit: 2024-07-14 | Discharge: 2024-07-16 | DRG: 806 | Disposition: A | Discharge status: Home / Self Care | Payer: Self-pay | Attending: Obstetrics | Admitting: Obstetrics

## 2024-07-14 ENCOUNTER — Other Ambulatory Visit: Payer: Self-pay

## 2024-07-14 ENCOUNTER — Encounter (HOSPITAL_COMMUNITY): Payer: Self-pay | Admitting: Obstetrics & Gynecology

## 2024-07-14 ENCOUNTER — Inpatient Hospital Stay (HOSPITAL_COMMUNITY): Payer: Self-pay

## 2024-07-14 ENCOUNTER — Encounter (HOSPITAL_COMMUNITY): Payer: Self-pay

## 2024-07-14 DIAGNOSIS — Z7901 Long term (current) use of anticoagulants: Secondary | ICD-10-CM | POA: Diagnosis not present

## 2024-07-14 DIAGNOSIS — D6859 Other primary thrombophilia: Secondary | ICD-10-CM | POA: Diagnosis not present

## 2024-07-14 DIAGNOSIS — Z3A39 39 weeks gestation of pregnancy: Secondary | ICD-10-CM

## 2024-07-14 DIAGNOSIS — Z86711 Personal history of pulmonary embolism: Secondary | ICD-10-CM | POA: Diagnosis not present

## 2024-07-14 DIAGNOSIS — Z833 Family history of diabetes mellitus: Secondary | ICD-10-CM

## 2024-07-14 DIAGNOSIS — Z3490 Encounter for supervision of normal pregnancy, unspecified, unspecified trimester: Principal | ICD-10-CM | POA: Diagnosis present

## 2024-07-14 DIAGNOSIS — D6851 Activated protein C resistance: Secondary | ICD-10-CM | POA: Diagnosis present

## 2024-07-14 DIAGNOSIS — Z86718 Personal history of other venous thrombosis and embolism: Secondary | ICD-10-CM

## 2024-07-14 DIAGNOSIS — O99214 Obesity complicating childbirth: Secondary | ICD-10-CM | POA: Diagnosis not present

## 2024-07-14 DIAGNOSIS — Z8249 Family history of ischemic heart disease and other diseases of the circulatory system: Secondary | ICD-10-CM

## 2024-07-14 DIAGNOSIS — O3663X0 Maternal care for excessive fetal growth, third trimester, not applicable or unspecified: Secondary | ICD-10-CM | POA: Diagnosis present

## 2024-07-14 DIAGNOSIS — O9912 Other diseases of the blood and blood-forming organs and certain disorders involving the immune mechanism complicating childbirth: Principal | ICD-10-CM | POA: Diagnosis present

## 2024-07-14 LAB — TYPE AND SCREEN
ABO/RH(D): A POS
Antibody Screen: NEGATIVE

## 2024-07-14 LAB — CBC
HCT: 34.5 % — ABNORMAL LOW (ref 36.0–46.0)
Hemoglobin: 11.4 g/dL — ABNORMAL LOW (ref 12.0–15.0)
MCH: 27.3 pg (ref 26.0–34.0)
MCHC: 33 g/dL (ref 30.0–36.0)
MCV: 82.7 fL (ref 80.0–100.0)
Platelets: 187 K/uL (ref 150–400)
RBC: 4.17 MIL/uL (ref 3.87–5.11)
RDW: 15.3 % (ref 11.5–15.5)
WBC: 11.7 K/uL — ABNORMAL HIGH (ref 4.0–10.5)
nRBC: 0 % (ref 0.0–0.2)

## 2024-07-14 LAB — RPR: RPR Ser Ql: NONREACTIVE

## 2024-07-14 MED ORDER — OXYCODONE HCL 5 MG PO TABS: 10.0000 mg | ORAL_TABLET | ORAL | Status: DC | PRN

## 2024-07-14 MED ORDER — ENOXAPARIN SODIUM 30 MG/0.3ML IJ SOSY
30.0000 mg | PREFILLED_SYRINGE | Freq: Two times a day (BID) | INTRAMUSCULAR | Status: AC
  Administered 2024-07-14 – 2024-07-16 (×4): 30 mg via SUBCUTANEOUS
  Filled 2024-07-14 (×5): qty 0.3

## 2024-07-14 MED ORDER — LACTATED RINGERS IV SOLN: 500.0000 mL | INTRAVENOUS | Status: DC | PRN

## 2024-07-14 MED ORDER — OXYTOCIN-SODIUM CHLORIDE 30-0.9 UT/500ML-% IV SOLN
1.0000 m[IU]/min | INTRAVENOUS | Status: DC
  Administered 2024-07-14: 2 m[IU]/min via INTRAVENOUS
  Filled 2024-07-14: qty 500

## 2024-07-14 MED ORDER — ONDANSETRON HCL 4 MG/2ML IJ SOLN: 4.0000 mg | INTRAMUSCULAR | Status: DC | PRN

## 2024-07-14 MED ORDER — SENNOSIDES-DOCUSATE SODIUM 8.6-50 MG PO TABS
2.0000 | ORAL_TABLET | ORAL | Status: DC
  Administered 2024-07-15 – 2024-07-16 (×2): 2 via ORAL
  Filled 2024-07-14 (×2): qty 2

## 2024-07-14 MED ORDER — ACETAMINOPHEN 325 MG PO TABS: 650.0000 mg | ORAL_TABLET | ORAL | Status: DC | PRN

## 2024-07-14 MED ORDER — ENOXAPARIN SODIUM 30 MG/0.3ML IJ SOSY: 30.0000 mg | PREFILLED_SYRINGE | Freq: Two times a day (BID) | INTRAMUSCULAR | Status: DC

## 2024-07-14 MED ORDER — ACETAMINOPHEN 325 MG PO TABS
650.0000 mg | ORAL_TABLET | ORAL | Status: DC | PRN
  Filled 2024-07-14: qty 2

## 2024-07-14 MED ORDER — OXYTOCIN BOLUS FROM INFUSION
333.0000 mL | Freq: Once | INTRAVENOUS | Status: AC
  Administered 2024-07-14: 333 mL via INTRAVENOUS

## 2024-07-14 MED ORDER — PRENATAL MULTIVITAMIN CH
1.0000 | ORAL_TABLET | Freq: Every day | ORAL | Status: DC
  Administered 2024-07-15 – 2024-07-16 (×2): 1 via ORAL
  Filled 2024-07-14 (×2): qty 1

## 2024-07-14 MED ORDER — LIDOCAINE HCL (PF) 1 % IJ SOLN: 30.0000 mL | INTRAMUSCULAR | Status: DC | PRN

## 2024-07-14 MED ORDER — LACTATED RINGERS IV SOLN: INTRAVENOUS | Status: DC

## 2024-07-14 MED ORDER — TERBUTALINE SULFATE 1 MG/ML IJ SOLN: 0.2500 mg | Freq: Once | INTRAMUSCULAR | Status: DC | PRN

## 2024-07-14 MED ORDER — DIBUCAINE (PERIANAL) 1 % EX OINT
1.0000 | TOPICAL_OINTMENT | CUTANEOUS | Status: DC | PRN
Start: 2024-07-14 — End: 2024-07-16

## 2024-07-14 MED ORDER — DIPHENHYDRAMINE HCL 25 MG PO CAPS: 25.0000 mg | ORAL_CAPSULE | Freq: Four times a day (QID) | ORAL | Status: DC | PRN

## 2024-07-14 MED ORDER — WITCH HAZEL-GLYCERIN EX PADS: 1.0000 | MEDICATED_PAD | CUTANEOUS | Status: DC | PRN

## 2024-07-14 MED ORDER — LACTATED RINGERS IV SOLN: 500.0000 mL | Freq: Once | INTRAVENOUS | Status: DC

## 2024-07-14 MED ORDER — FENTANYL CITRATE (PF) 100 MCG/2ML IJ SOLN: 50.0000 ug | INTRAMUSCULAR | Status: DC | PRN

## 2024-07-14 MED ORDER — SOD CITRATE-CITRIC ACID 500-334 MG/5ML PO SOLN: 30.0000 mL | ORAL | Status: DC | PRN

## 2024-07-14 MED ORDER — ONDANSETRON HCL 4 MG/2ML IJ SOLN: 4.0000 mg | Freq: Four times a day (QID) | INTRAMUSCULAR | Status: DC | PRN

## 2024-07-14 MED ORDER — ZOLPIDEM TARTRATE 5 MG PO TABS: 5.0000 mg | ORAL_TABLET | Freq: Every evening | ORAL | Status: DC | PRN

## 2024-07-14 MED ORDER — PHENYLEPHRINE 80 MCG/ML (10ML) SYRINGE FOR IV PUSH (FOR BLOOD PRESSURE SUPPORT): 80.0000 ug | PREFILLED_SYRINGE | INTRAVENOUS | Status: DC | PRN

## 2024-07-14 MED ORDER — ONDANSETRON HCL 4 MG PO TABS: 4.0000 mg | ORAL_TABLET | ORAL | Status: DC | PRN

## 2024-07-14 MED ORDER — EPHEDRINE 5 MG/ML INJ: 10.0000 mg | INTRAVENOUS | Status: DC | PRN

## 2024-07-14 MED ORDER — FENTANYL-BUPIVACAINE-NACL 0.5-0.125-0.9 MG/250ML-% EP SOLN: 12.0000 mL/h | EPIDURAL | Status: DC | PRN

## 2024-07-14 MED ORDER — OXYCODONE HCL 5 MG PO TABS: 5.0000 mg | ORAL_TABLET | ORAL | Status: DC | PRN

## 2024-07-14 MED ORDER — BENZOCAINE-MENTHOL 20-0.5 % EX AERO
1.0000 | INHALATION_SPRAY | CUTANEOUS | Status: DC | PRN
Start: 2024-07-14 — End: 2024-07-16
  Administered 2024-07-14: 1 via TOPICAL
  Filled 2024-07-14: qty 56

## 2024-07-14 MED ORDER — SIMETHICONE 80 MG PO CHEW: 80.0000 mg | CHEWABLE_TABLET | ORAL | Status: DC | PRN

## 2024-07-14 MED ORDER — OXYTOCIN-SODIUM CHLORIDE 30-0.9 UT/500ML-% IV SOLN: 2.5000 [IU]/h | INTRAVENOUS | Status: DC

## 2024-07-14 MED ORDER — TETANUS-DIPHTH-ACELL PERTUSSIS 5-2.5-18.5 LF-MCG/0.5 IM SUSY: 0.5000 mL | PREFILLED_SYRINGE | Freq: Once | INTRAMUSCULAR | Status: DC

## 2024-07-14 MED ORDER — MISOPROSTOL 25 MCG QUARTER TABLET
25.0000 ug | ORAL_TABLET | ORAL | Status: DC
  Administered 2024-07-14 (×2): 25 ug via VAGINAL
  Filled 2024-07-14 (×5): qty 1

## 2024-07-14 MED ORDER — COCONUT OIL OIL
1.0000 | TOPICAL_OIL | Status: DC | PRN
  Administered 2024-07-14 – 2024-07-16 (×2): 1 via TOPICAL

## 2024-07-14 MED ORDER — DIPHENHYDRAMINE HCL 50 MG/ML IJ SOLN: 12.5000 mg | INTRAMUSCULAR | Status: DC | PRN

## 2024-07-14 MED ORDER — ENOXAPARIN SODIUM 120 MG/0.8ML IJ SOSY
120.0000 mg | PREFILLED_SYRINGE | Freq: Two times a day (BID) | INTRAMUSCULAR | Status: DC
  Filled 2024-07-14: qty 0.8

## 2024-07-14 NOTE — Anesthesia Preprocedure Evaluation (Signed)
 Anesthesia Evaluation  Patient identified by MRN, date of birth, ID band Patient awake    Reviewed: Allergy & Precautions, NPO status , Patient's Chart, lab work & pertinent test results  Airway Mallampati: II  TM Distance: >3 FB Neck ROM: Full    Dental no notable dental hx. (+) Teeth Intact, Dental Advisory Given   Pulmonary neg pulmonary ROS   Pulmonary exam normal breath sounds clear to auscultation       Cardiovascular + DVT (hx  Factor V &Antithrombin 3 deficiei=ncy on lifelong anticoagulation)  Normal cardiovascular exam Rhythm:Regular Rate:Normal     Neuro/Psych negative neurological ROS     GI/Hepatic negative GI ROS, Neg liver ROS,,,  Endo/Other  negative endocrine ROS    Renal/GU negative Renal ROS     Musculoskeletal negative musculoskeletal ROS (+)    Abdominal  (+) + obese  Peds  Hematology Heparin  last dose 8/31 Lab Results      Component                Value               Date                      WBC                      11.7 (H)            07/14/2024                HGB                      11.4 (L)            07/14/2024                HCT                      34.5 (L)            07/14/2024                MCV                      82.7                07/14/2024                PLT                      187                 07/14/2024              Anesthesia Other Findings All: codeine ibuprofen  Reproductive/Obstetrics (+) Pregnancy                              Anesthesia Physical Anesthesia Plan  ASA: 3  Anesthesia Plan: Epidural   Post-op Pain Management:    Induction:   PONV Risk Score and Plan:   Airway Management Planned:   Additional Equipment:   Intra-op Plan:   Post-operative Plan:   Informed Consent: I have reviewed the patients History and Physical, chart, labs and discussed the procedure including the risks, benefits and alternatives for the  proposed anesthesia with the patient or authorized representative who has indicated his/her understanding and  acceptance.       Plan Discussed with:   Anesthesia Plan Comments: (39.1 wk G2P1 w factor V def for LEA)        Anesthesia Quick Evaluation

## 2024-07-14 NOTE — Progress Notes (Addendum)
 S: Doing well, no complaints, pain well controlled as just starting to feel contractions after 2nd cytotec .   O: BP 102/62   Pulse 94   Temp 97.7 F (36.5 C) (Oral)   Resp 16   Ht 5' 10 (1.778 m)   Wt 127.1 kg   SpO2 97%   BMI 40.21 kg/m    FHT:  FHR: 130s bpm, variability: moderate,  accelerations:  Present,  decelerations:  Absent UC:   not tracing well SVE:   Dilation: 1 Effacement (%): Thick Station: [employer] Exam by:: Lacinda Gamer, RN   A / P:  33 y.o.  OB History  Gravida Para Term Preterm AB Living  2 1 1  0 0 1  SAB IAB Ectopic Multiple Live Births  0 0 0 0 1   at [redacted]w[redacted]d IOL through heparin  window for FVL, ATIII and h/o DVT/ PE  Fetal Wellbeing:  Category I Pain Control:  Labor support without medications  Anticipated MOD:  NSVD. Known LGA  Burnard DELENA Bowers 07/14/2024, 7:33 AM   Cervical foley placement. Cvx 1.5cm. 50% effaced, vtx well applied. Consent obtained. Foley threaded through cvx and inflated to 60 cc. Pt tolerated well.  Burnard DELENA Bowers 07/14/2024 7:40 AM

## 2024-07-14 NOTE — Lactation Note (Signed)
 This note was copied from a baby's chart. Lactation Consultation Note  Patient Name: Heather Lloyd Unijb'd Date: 07/14/2024 Age:32 hours Reason for consult: Initial assessment;Term.  Prior to latch MOB did reverse pressure softening to help evert nipple outward . MOB latched infant on her right breast using the football hold position and pillow support , infant was still breastfeeding after 10 minutes when LC left the room. MOB will continue to work on latching infant at the breast and knows to call for further latch assistance if needed. MOB will continue to breastfeed infant by cues, on demand, 8-12 times within 24 hours, skin to skin. LC discussed the importance of maternal rest, meals and hydration. MOB was  made aware of O/P services, breastfeeding support groups, community resources, and our phone # for post-discharge questions.    Maternal Data Has patient been taught Hand Expression?: Yes  Feeding Mother's Current Feeding Choice: Breast Milk and Formula Nipple Type: Slow - flow  LATCH Score Latch: Grasps breast easily, tongue down, lips flanged, rhythmical sucking.  Audible Swallowing: A few with stimulation  Type of Nipple: Flat  Comfort (Breast/Nipple): Soft / non-tender  Hold (Positioning): Assistance needed to correctly position infant at breast and maintain latch.  LATCH Score: 7   Lactation Tools Discussed/Used    Interventions Interventions: Breast feeding basics reviewed;Assisted with latch;Skin to skin;Breast compression;Hand express;Breast massage;Adjust position;Reverse pressure;Support pillows;Position options;Expressed milk;Education;CDC milk storage guidelines;CDC Guidelines for Breast Pump Cleaning;Guidelines for Milk Supply and Pumping Schedule Handout;LC Services brochure  Discharge Pump: DEBP;Hands Free  Consult Status Consult Status: Follow-up Date: 07/15/24 Follow-up type: In-patient    Grayce LULLA Batter 07/14/2024, 11:00 PM

## 2024-07-14 NOTE — Progress Notes (Signed)
 S: Doing well, no complaints, pain well controlled as labor early. S/p cytotec  x 2, cervical foley bulb and pitocin   O: BP 110/69   Pulse 87   Temp 98.8 F (37.1 C) (Oral)   Resp 14   Ht 5' 10 (1.778 m)   Wt 127.1 kg   SpO2 97%   BMI 40.21 kg/m    FHT:  FHR: 130s bpm, variability: moderate,  accelerations:  Present,  decelerations:  Absent UC:   irritbility, pit at 8 munits/ min SVE:   Dilation: 4.5 Effacement (%): 60 Station: -2 Exam by:: KYM Eagles, RNC AROM clear 5/50/-2  A / P:  33 y.o.  OB History  Gravida Para Term Preterm AB Living  2 1 1  0 0 1  SAB IAB Ectopic Multiple Live Births  0 0 0 0 1   at [redacted]w[redacted]d IOL, heparin  window  Fetal Wellbeing:  Category I Pain Control:  Labor support without medications  Anticipated MOD:  NSVD  Burnard DELENA Bowers 07/14/2024, 2:48 PM

## 2024-07-15 LAB — CBC
HCT: 31.9 % — ABNORMAL LOW (ref 36.0–46.0)
Hemoglobin: 10.5 g/dL — ABNORMAL LOW (ref 12.0–15.0)
MCH: 27.7 pg (ref 26.0–34.0)
MCHC: 32.9 g/dL (ref 30.0–36.0)
MCV: 84.2 fL (ref 80.0–100.0)
Platelets: 157 K/uL (ref 150–400)
RBC: 3.79 MIL/uL — ABNORMAL LOW (ref 3.87–5.11)
RDW: 15.5 % (ref 11.5–15.5)
WBC: 12.5 K/uL — ABNORMAL HIGH (ref 4.0–10.5)
nRBC: 0 % (ref 0.0–0.2)

## 2024-07-15 NOTE — Lactation Note (Signed)
 This note was copied from a baby's chart. Lactation Consultation Note  Patient Name: Heather Lloyd Date: 07/15/2024 Age:32 hours Reason for consult: RN request;Early term 37-38.6wks;Follow-up assessment;Maternal endocrine disorderPCOS and PreDM  LC in to assist with latch to the breast.  Mom has been struggling and RN states baby has been tongue thrusting and having a difficult time with a bottle nipple.  Mom states she would really like to breastfeed as her first baby (2 yrs old), wouldn't latch and she pumped for 3 months for him.  Baby showing feeding cues.  LC asked Mom if she would like to try to latch baby and she was agreeable.    Baby placed STS by left breast in football hold.  LC watched as Mom did a great job of bringing baby onto the breast, but latch was sustained.  LC assisted and showed Mom how to wait for baby's wide mouth and bring her on quickly.  Baby was able to latch with flanged lips onto the areola and suck with deep jaw extensions and pauses.  Mom feeling cramping after about 5 mins.    Lots of teaching done while baby was sucking with a nutritive suck pattern.  Mom shown how to offer breast compression gently without moving hand on the breast.  Mom encouraged to talk softly to baby during feeding to encouraged continued sucking.  Plan recommended- 1-STS with baby watching for feeding cues 2- Offer the breast with strong cues, if baby fussy and unable to latch, Mom to supplement first and offer the breast after. 3- if baby is contented after a feeding on the breast, no need to supplement. 4- always pump both breasts 15-20 mins if baby is supplemented by bottle 5- OP lactation f/u   LATCH Score Latch: Grasps breast easily, tongue down, lips flanged, rhythmical sucking.  Audible Swallowing: Spontaneous and intermittent  Type of Nipple: Everted at rest and after stimulation (short nipple shafts and compressible areola)  Comfort (Breast/Nipple): Soft /  non-tender  Hold (Positioning): Assistance needed to correctly position infant at breast and maintain latch.  LATCH Score: 9   Lactation Tools Discussed/Used Tools: Pump Breast pump type: Manual Reason for Pumping: pre-pump prn  Interventions Interventions: Breast feeding basics reviewed;Assisted with latch;Skin to skin;Breast massage;Hand express;Pre-pump if needed;Breast compression;Adjust position;Support pillows;Position options;Expressed milk;Hand pump;Education  Discharge Discharge Education: Engorgement and breast care;Outpatient recommendation;Outpatient Epic message sent Pump: Personal;Hands Free (Motif hands free)  Consult Status Consult Status: Complete Date: 07/15/24 Follow-up type: Out-patient    Claudene Aleck BRAVO 07/15/2024, 5:46 PM

## 2024-07-15 NOTE — Progress Notes (Signed)
 No c/o, pain controlled Nml lochia, voids w/o difficulty Breastfeeding  Patient Vitals for the past 24 hrs:  BP Temp Temp src Pulse Resp SpO2  07/15/24 0930 113/73 98.3 F (36.8 C) Oral 81 18 99 %  07/15/24 0545 121/65 98.7 F (37.1 C) Oral 89 18 98 %  07/15/24 0315 125/68 98 F (36.7 C) Oral 89 18 99 %  07/14/24 2118 128/70 98 F (36.7 C) Oral 93 18 98 %  07/14/24 2000 126/70 97.8 F (36.6 C) Oral 89 18 99 %  07/14/24 1846 121/63 -- -- 89 16 --  07/14/24 1831 121/62 98.8 F (37.1 C) Oral 77 14 --  07/14/24 1816 117/65 -- -- 80 -- --  07/14/24 1800 132/72 -- -- 85 18 --  07/14/24 1754 128/65 -- -- (!) 128 -- --  07/14/24 1339 110/69 98.8 F (37.1 C) Oral 87 14 --  07/14/24 1105 127/72 -- -- 81 16 --   A&ox3 Nml respirations Abd: soft,nt,nd; fundus firm and below umb; umbilical hernia - soft,nt LE: no edema,nt bilat     Latest Ref Rng & Units 07/15/2024    5:12 AM 07/14/2024   12:23 AM 11/14/2023   11:04 AM  CBC  WBC 4.0 - 10.5 K/uL 12.5  11.7  8.0   Hemoglobin 12.0 - 15.0 g/dL 89.4  88.5  87.7   Hematocrit 36.0 - 46.0 % 31.9  34.5  36.9   Platelets 150 - 400 K/uL 157  187  245    A/P: ppd1 s/p svd Doing well, would like d/c home today pending baby cleared by peds Anemia of pregnancy with acute change d/t blood loss - not sig; asymptomatic; plan iron daily Factor v leiden, antithrombin III  with h/o dvt and PE - per hematologist: lovenox  30mg  q 12 hr x48 hr then 120mg  q 12 hr; needs rx for 30mg ; change to xarelto when not breastfeeding Obesity Rubella immune Rh pos Breastfeeding/girl

## 2024-07-16 ENCOUNTER — Other Ambulatory Visit: Payer: Self-pay | Admitting: Hematology & Oncology

## 2024-07-16 DIAGNOSIS — D6851 Activated protein C resistance: Secondary | ICD-10-CM

## 2024-07-16 DIAGNOSIS — Z86718 Personal history of other venous thrombosis and embolism: Secondary | ICD-10-CM

## 2024-07-16 NOTE — Lactation Note (Signed)
 This note was copied from a baby's chart. Lactation Consultation Note  Patient Name: Heather Lloyd Date: 07/16/2024 Age:31 hours, P2 , skin Bili 9.9 at 35 hours - Serum ordered Reason for consult: Follow-up assessment Per mom the baby ate 40 mins at 0730 and then was supplemented with formula.  LC recommended since the output correlates with the weight loss , after feeding the 1st breast if still hungry offer the 2nd breast prior to formula.  If baby is still hungry can top off with EBM or formula.  LC reviewed engorgement prevention and tx, importance of feeding with cues and by 3 hours around the clock STS until the baby is back to birth weight and gaining well.  LC placed a request for LC O/P appt at moms request.    Maternal Data Has patient been taught Hand Expression?: Yes Does the patient have breastfeeding experience prior to this delivery?: Yes How long did the patient breastfeed?: 3 months total  Feeding Mother's Current Feeding Choice: Breast Milk and Formula  LATCH Score - last latch score 9     Lactation Tools Discussed/Used  Hand pump   Interventions Interventions: Breast feeding basics reviewed;Hand pump;Education;LC Services brochure;CDC Guidelines for Breast Pump Cleaning;CDC milk storage guidelines  Discharge Discharge Education: Engorgement and breast care;Warning signs for feeding baby;Outpatient recommendation;Outpatient Epic message sent;Other (comment) (mom requested for [facility] O/P and aware she will receive a call) Pump: Hands Free;Manual;Personal  Consult Status Consult Status: Complete Date: 07/16/24    Rollene Caldron Leesha Veno 07/16/2024, 9:06 AM

## 2024-07-16 NOTE — Discharge Summary (Signed)
 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 yo G2P2002 at [redacted]w[redacted]d was admitted to the hospital 07/14/2024 for induction of labor.  Indication for induction: Anticoagulation window .  Patient had an labor course complicated by none Membrane Rupture Time/Date: 2:51 PM,07/14/2024  Delivery Method:Vaginal, Spontaneous  did not use epidural  Operative Delivery:N/A Episiotomy: None Lacerations:  None Details of delivery can be found in separate delivery note.  Patient had a postpartum course complicated by none. Patient is discharged home 07/16/24.  Newborn Data: Birth date:07/14/2024 Birth time:5:46 PM Gender:Female Living status:Living Apgars:8 ,9  Weight:3690 g  Magnesium Sulfate received: NA BMZ received: NA Rhophylac:N/A MMR:N/A T-DaP:Given prenatally Flu: No RSV Vaccine received: No Transfusion:No Immunizations administered: There is no immunization history for the selected administration types on file for this patient.  Physical exam  Vitals:   07/15/24 0545 07/15/24 0930 07/15/24 2048 07/16/24 0510  BP: 121/65 113/73 120/76 119/65  Pulse: 89 81 91 91  Resp: 18 18 19 18   Temp: 98.7 F (37.1 C) 98.3 F (36.8 C) 98.2 F (36.8 C) 98.6 F (37 C)  TempSrc: Oral Oral Oral   SpO2: 98%  99% 99% 98%  Weight:      Height:       General: alert, cooperative, and no distress Lochia: appropriate Uterine Fundus: firm Incision: N/A DVT Evaluation: No evidence of DVT seen on physical exam. Negative Homan's sign. Labs: Lab Results  Component Value Date   WBC 12.5 (H) 07/15/2024   HGB 10.5 (L) 07/15/2024   HCT 31.9 (L) 07/15/2024   MCV 84.2 07/15/2024   PLT 157 07/15/2024      Latest Ref Rng & Units 11/14/2023   11:04 AM  CMP  Glucose 70 - 99 mg/dL 895   BUN 6 - 20 mg/dL 13   Creatinine 9.55 - 1.00 mg/dL 9.17   Sodium 864 - 854 mmol/L 138   Potassium 3.5 - 5.1 mmol/L 3.9   Chloride 98 - 111 mmol/L 107   CO2 22 - 32 mmol/L 22   Calcium 8.9 - 10.3 mg/dL 9.1   Total Protein 6.5 - 8.1 g/dL 7.5   Total Bilirubin 0.0 - 1.2 mg/dL 0.5   Alkaline Phos 38 - 126 U/L 42   AST 15 - 41 U/L 21   ALT 0 - 44 U/L 24    Edinburgh Score:    07/15/2024    5:15 PM  Edinburgh Postnatal Depression Scale Screening Tool  I have been able to laugh and see the funny side of things. 0  I have looked forward with enjoyment to things. 0  I have blamed myself unnecessarily when things went wrong. 1  I have been anxious or worried for no good reason. 0  I have felt scared or panicky for no good reason. 0  Things have been getting on top of me. 1  I have been so unhappy that I have had difficulty sleeping. 0  I have felt sad or miserable. 0  I have been so unhappy that I have been crying. 0  The thought of harming myself has occurred to me. 0  Edinburgh Postnatal Depression Scale Total 2      After visit meds:  Allergies as of 07/16/2024       Reactions   Codeine Hives, Swelling   Ibuprofen Other (See Comments)   Pt is on blood thinners        Medication List     STOP taking these medications    cyclobenzaprine 10 MG tablet Commonly known as: FLEXERIL   heparin  10000 UNIT/ML injection   prenatal multivitamin Tabs tablet       TAKE these medications    enoxaparin   120 MG/0.8ML injection Commonly known as: LOVENOX  INJECT 0.8 ML UNDER THE SKIN EVERY 12 HOURS   polyethylene glycol 17 g packet Commonly known as: MIRALAX / GLYCOLAX Take 17 g by mouth daily as needed.         Discharge home in stable condition Infant Feeding: Bottle and Breast Infant Disposition:home with mother but needs to stay today for possible phototherapy  Discharge instruction: per After Visit Summary and Postpartum booklet. Activity: Advance as tolerated. Pelvic rest for 6 weeks.  Diet: routine diet Anticipated Birth Control: IUD and Unsure Postpartum Appointment:6 weeks Additional Postpartum F/U: as needed  Future Appointments: Future Appointments  Date Time Provider Department Center  10/19/2024 11:00 AM [facility] LAB [facility] None  10/19/2024 11:15 AM C[place]ton, Lauraine HERO, PA-C [facility] None   Follow up Visit:  Follow-up Information     Barbette Knock, MD Follow up.   Specialty: Obstetrics and Gynecology Contact information: [address] [place] [address] [phone]                     07/16/2024 Knock JONELLE Barbette, MD

## 2024-07-18 NOTE — Lactation Note (Signed)
 This note was copied from a baby's chart. Lactation Consultation Note  Patient Name: Heather Lloyd Unijb'd Date: 07/18/2024 Age:33 years, P2  Reason for consult: Follow-up assessment;Term (baby off the lights , weight gain) As LC entered the room mom feeding the baby EBM .  Mom has been consistently pumping around the clock and last pumping was 60 ml.  Per mom the baby was latching well after the assistance the other day and when the baby was placed on  Photo tx lights and had to get bottles due to the weight loss, the baby has struggled to latch.  LC provided ideas of how to re-latch the baby at the breast,  LC reviewed breast feeding D/C teaching and the [facility] resources.  Per mom plans to check her Peds office for [facility] O/P and if not will [facility].    Maternal Data Does the patient have breastfeeding experience prior to this delivery?: Yes  Feeding Mother's Current Feeding Choice: Breast Milk Nipple Type: Slow - flow   Lactation Tools Discussed/Used Tools: Pump Breast pump type: Manual;Double-Electric Breast Pump Pump Education: Milk Storage;Setup, frequency, and cleaning  Interventions Interventions: Breast feeding basics reviewed;Hand pump;DEBP;Education;LC Services brochure;CDC milk storage guidelines;CDC Guidelines for Breast Pump Cleaning  Discharge Discharge Education: Engorgement and breast care;Warning signs for feeding baby Pump: Hands Free;Manual;Personal  Consult Status Consult Status: Complete Date: 07/18/24    Rollene Jenkins Fiedler 07/18/2024, 12:51 PM

## 2024-07-20 ENCOUNTER — Encounter: Payer: Self-pay | Admitting: Lactation Services

## 2024-07-23 ENCOUNTER — Telehealth (HOSPITAL_COMMUNITY): Payer: Self-pay | Admitting: *Deleted

## 2024-07-23 NOTE — Telephone Encounter (Signed)
 07/23/2024  Name: GUDRUN AXE MRN: [id number] DOB: 1991/08/18  Reason for Call:  Transition of Care Hospital Discharge Call  Contact Status: Patient Contact Status: Message  Language assistant needed:          Follow-Up Questions:    Van Postnatal Depression Scale:  In the Past 7 Days:    PHQ2-9 Depression Scale:     Discharge Follow-up:    Post-discharge interventions: NA  Takiya Belmares,RN  07/23/2024 1613

## 2024-08-14 ENCOUNTER — Ambulatory Visit: Payer: Self-pay | Admitting: General Surgery

## 2024-08-14 DIAGNOSIS — K432 Incisional hernia without obstruction or gangrene: Secondary | ICD-10-CM | POA: Diagnosis not present

## 2024-08-20 ENCOUNTER — Other Ambulatory Visit: Payer: Self-pay | Admitting: Medical Oncology

## 2024-08-25 ENCOUNTER — Other Ambulatory Visit: Payer: Self-pay | Admitting: Hematology & Oncology

## 2024-08-25 DIAGNOSIS — Z86718 Personal history of other venous thrombosis and embolism: Secondary | ICD-10-CM

## 2024-08-25 DIAGNOSIS — D6851 Activated protein C resistance: Secondary | ICD-10-CM

## 2024-09-24 ENCOUNTER — Other Ambulatory Visit: Payer: Self-pay | Admitting: Hematology & Oncology

## 2024-09-24 DIAGNOSIS — D6851 Activated protein C resistance: Secondary | ICD-10-CM

## 2024-09-24 DIAGNOSIS — Z86718 Personal history of other venous thrombosis and embolism: Secondary | ICD-10-CM

## 2024-10-19 ENCOUNTER — Ambulatory Visit: Admitting: Medical Oncology

## 2024-10-19 ENCOUNTER — Encounter: Payer: Self-pay | Admitting: Medical Oncology

## 2024-10-19 ENCOUNTER — Other Ambulatory Visit: Payer: Self-pay | Admitting: Medical Oncology

## 2024-10-19 ENCOUNTER — Inpatient Hospital Stay: Payer: Self-pay | Attending: Medical Oncology

## 2024-10-19 VITALS — BP 127/78 | HR 72 | Temp 97.6°F | Resp 19 | Wt 276.0 lb

## 2024-10-19 DIAGNOSIS — Z86711 Personal history of pulmonary embolism: Secondary | ICD-10-CM | POA: Insufficient documentation

## 2024-10-19 DIAGNOSIS — Z86718 Personal history of other venous thrombosis and embolism: Secondary | ICD-10-CM | POA: Diagnosis not present

## 2024-10-19 DIAGNOSIS — I871 Compression of vein: Secondary | ICD-10-CM | POA: Diagnosis not present

## 2024-10-19 DIAGNOSIS — Z7901 Long term (current) use of anticoagulants: Secondary | ICD-10-CM | POA: Insufficient documentation

## 2024-10-19 DIAGNOSIS — D6851 Activated protein C resistance: Secondary | ICD-10-CM | POA: Insufficient documentation

## 2024-10-19 LAB — CMP (CANCER CENTER ONLY)
ALT: 49 U/L — ABNORMAL HIGH (ref 0–44)
AST: 25 U/L (ref 15–41)
Albumin: 4.5 g/dL (ref 3.5–5.0)
Alkaline Phosphatase: 66 U/L (ref 38–126)
Anion gap: 14 (ref 5–15)
BUN: 16 mg/dL (ref 6–20)
CO2: 21 mmol/L — ABNORMAL LOW (ref 22–32)
Calcium: 9.2 mg/dL (ref 8.9–10.3)
Chloride: 105 mmol/L (ref 98–111)
Creatinine: 0.89 mg/dL (ref 0.44–1.00)
GFR, Estimated: >60 mL/min (ref >60)
Glucose, Bld: 119 mg/dL — ABNORMAL HIGH (ref 70–99)
Potassium: 3.8 mmol/L (ref 3.5–5.1)
Sodium: 140 mmol/L (ref 135–145)
Total Bilirubin: 0.4 mg/dL (ref 0.0–1.2)
Total Protein: 7.5 g/dL (ref 6.5–8.1)

## 2024-10-19 LAB — CBC
HCT: 39.2 % (ref 36.0–46.0)
Hemoglobin: 13.1 g/dL (ref 12.0–15.0)
MCH: 27.7 pg (ref 26.0–34.0)
MCHC: 33.4 g/dL (ref 30.0–36.0)
MCV: 82.9 fL (ref 80.0–100.0)
Platelets: 253 K/uL (ref 150–400)
RBC: 4.73 MIL/uL (ref 3.87–5.11)
RDW: 13.8 % (ref 11.5–15.5)
WBC: 9.5 K/uL (ref 4.0–10.5)
nRBC: 0 % (ref 0.0–0.2)

## 2024-10-19 NOTE — Progress Notes (Signed)
 Hematology and Oncology Follow Up Visit  Heather Lloyd [id number] January 27, 1991 33 y.o. 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   She is doing well. Her daughter is now 92 months old. She is breastfeeding.  No clots noted. No other blood loss noted.  She does occasionally get firm superficial knots on her left leg. This has occurred for years and tends to happen when she doesn't wear her compression socks. No leg swelling, calf pain, Chest pains or SOB. Has not been shown to be DVT in the past.  No abnormal bruising or petechiae.  No fever, chills, cough, rash, dizziness, SOB, chest pain, abdominal pain or changes in bowel or bladder habits.  She has had some nausea and occasional dizziness  She has occasional palpitations that come and go.  No swelling, tenderness, numbness or tingling in her extremities.  No falls or syncope reported.  Appetite and hydration are good.   ECOG Performance Status: 1 - Symptomatic but completely ambulatory  Medications:  Allergies as of 10/19/2024       Reactions   Codeine Hives, Swelling   Ibuprofen Other (See Comments)   Pt is on blood thinners        Medication List        Accurate as of October 19, 2024 12:04 PM. If you have any questions, ask your nurse or doctor.          enoxaparin  120 MG/0.8ML injection Commonly known as: LOVENOX  INJECT CONTENTS OF 1 SYRINGE UNDER THE SKIN EVERY 12 HOURS   polyethylene glycol 17 g packet Commonly known as: MIRALAX / GLYCOLAX Take 17 g by mouth daily as needed.   PRENATAL VITAMINS PO daily at 6 (six) AM.   Vitamin D-1000 Max St 25  MCG (1000 UT) tablet Generic drug: Cholecalciferol Take 1,000 Units by mouth daily.        Allergies:  Allergies  Allergen Reactions   Codeine Hives and Swelling   Ibuprofen Other (See Comments)    Pt is on blood thinners     Past Medical History, Surgical history, Social history, and Family History were reviewed and updated.  Review of Systems: All other 10 point review of systems is negative.   Physical Exam:  weight is 276 lb (125.2 kg). Her oral temperature is 97.6 F (36.4 C). Her blood pressure is 127/78 and her pulse is 72. Her respiration is 19 and oxygen saturation is 100%.   Wt Readings from Last 3 Encounters:  10/19/24 276 lb (125.2 kg)  07/14/24 280 lb 3.3 oz (127.1 kg)  11/14/23 268 lb 12.8 oz (121.9 kg)    Ocular: Sclerae unicteric, pupils equal, round and reactive to light Ear-nose-throat: Oropharynx clear, dentition fair Lymphatic: No cervical or supraclavicular adenopathy Lungs no rales or rhonchi, good excursion bilaterally Heart regular rate and rhythm, no murmur appreciated Abd soft, nontender, positive bowel sounds MSK no focal spinal tenderness, no joint edema Neuro: non-focal, well-oriented, appropriate affect   Lab Results  Component Value Date   WBC 9.5 10/19/2024   HGB 13.1 10/19/2024   HCT 39.2 10/19/2024   MCV 82.9 10/19/2024  PLT 253 10/19/2024   No results found for: FERRITIN, IRON, TIBC, UIBC, IRONPCTSAT Lab Results  Component Value Date   RETICCTPCT 2.24 (H) 04/25/2017   RBC 4.73 10/19/2024   RETICCTABS 105.73 (H) 04/25/2017   No results found for: KPAFRELGTCHN, LAMBDASER, KAPLAMBRATIO No results found for: IGGSERUM, IGA, IGMSERUM No results found for: STEPHANY CARLOTA BENSON MARKEL EARLA JOANNIE DOC VICK, SPEI   Chemistry      Component Value Date/Time   NA 138 11/14/2023 1104   NA 140 04/25/2017 1550   K 3.9 11/14/2023 1104   K 3.8 04/25/2017 1550   CL 107  11/14/2023 1104   CO2 22 11/14/2023 1104   CO2 26 04/25/2017 1550   BUN 13 11/14/2023 1104   BUN 12.5 04/25/2017 1550   CREATININE 0.82 11/14/2023 1104   CREATININE 1.0 04/25/2017 1550      Component Value Date/Time   CALCIUM 9.1 11/14/2023 1104   CALCIUM 9.6 04/25/2017 1550   ALKPHOS 42 11/14/2023 1104   ALKPHOS 60 04/25/2017 1550   AST 21 11/14/2023 1104   AST 23 04/25/2017 1550   ALT 24 11/14/2023 1104   ALT 28 04/25/2017 1550   BILITOT 0.5 11/14/2023 1104   BILITOT 0.48 04/25/2017 1550     Encounter Diagnoses  Name Primary?   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   Today CBC is normal.  CMP pending No sign of thrombus recurrence although I have suggested a vascular referral and dermatology evaluation for the areas that occur. She will consider this.  She will continue her current Lovenox  regimen until she stops breast feeding and then will trial Xarleto given her past concerns with Eliquis  potentially causing weight gain.   RTC 6 months APP (Virtual per pt request)  Lauraine CHRISTELLA Dais, PA-C 12/8/202512:04 PM

## 2024-10-20 ENCOUNTER — Ambulatory Visit: Payer: Self-pay | Admitting: Medical Oncology

## 2024-10-23 ENCOUNTER — Other Ambulatory Visit: Payer: Self-pay | Admitting: Hematology & Oncology

## 2024-10-23 DIAGNOSIS — Z86718 Personal history of other venous thrombosis and embolism: Secondary | ICD-10-CM

## 2024-10-23 DIAGNOSIS — D6851 Activated protein C resistance: Secondary | ICD-10-CM

## 2024-10-27 ENCOUNTER — Other Ambulatory Visit: Payer: Self-pay

## 2024-10-27 DIAGNOSIS — D6851 Activated protein C resistance: Secondary | ICD-10-CM

## 2024-10-27 DIAGNOSIS — Z86718 Personal history of other venous thrombosis and embolism: Secondary | ICD-10-CM

## 2024-10-27 DIAGNOSIS — I82512 Chronic embolism and thrombosis of left femoral vein: Secondary | ICD-10-CM

## 2024-11-25 ENCOUNTER — Other Ambulatory Visit: Payer: Self-pay | Admitting: Hematology & Oncology

## 2024-11-25 DIAGNOSIS — Z86718 Personal history of other venous thrombosis and embolism: Secondary | ICD-10-CM

## 2024-11-25 DIAGNOSIS — D6851 Activated protein C resistance: Secondary | ICD-10-CM

## 2025-03-12 ENCOUNTER — Ambulatory Visit (HOSPITAL_COMMUNITY)

## 2025-03-12 ENCOUNTER — Encounter: Admitting: Vascular Surgery

## 2025-04-19 ENCOUNTER — Inpatient Hospital Stay: Admitting: Medical Oncology
